# Patient Record
Sex: Female | Born: 1990 | Hispanic: Yes | Marital: Married | Smoking: Never smoker
Health system: Southern US, Community
[De-identification: ages and names within clinical notes are randomized; demographics above are authoritative.]

## PROBLEM LIST (undated history)

## (undated) ENCOUNTER — Inpatient Hospital Stay: Payer: Self-pay

## (undated) DIAGNOSIS — R519 Headache, unspecified: Secondary | ICD-10-CM

## (undated) DIAGNOSIS — Z789 Other specified health status: Secondary | ICD-10-CM

## (undated) DIAGNOSIS — Z8611 Personal history of tuberculosis: Secondary | ICD-10-CM

## (undated) HISTORY — DX: Personal history of tuberculosis: Z86.11

## (undated) HISTORY — DX: Headache, unspecified: R51.9

## (undated) HISTORY — PX: NO PAST SURGERIES: SHX2092

---
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
## 2008-03-07 ENCOUNTER — Emergency Department: Payer: Self-pay | Admitting: Emergency Medicine

## 2009-06-10 ENCOUNTER — Ambulatory Visit: Payer: Self-pay | Admitting: Family Medicine

## 2010-01-06 ENCOUNTER — Inpatient Hospital Stay: Payer: Self-pay

## 2010-05-02 ENCOUNTER — Ambulatory Visit: Payer: Self-pay | Admitting: Family Medicine

## 2013-02-06 ENCOUNTER — Emergency Department: Payer: Self-pay | Admitting: Emergency Medicine

## 2013-10-11 ENCOUNTER — Encounter (HOSPITAL_COMMUNITY): Payer: Self-pay | Admitting: Emergency Medicine

## 2013-10-11 ENCOUNTER — Emergency Department (HOSPITAL_COMMUNITY)
Admission: EM | Admit: 2013-10-11 | Discharge: 2013-10-11 | Disposition: A | Discharge status: Home / Self Care | Payer: BC Managed Care – PPO | Attending: Emergency Medicine | Admitting: Emergency Medicine

## 2013-10-11 DIAGNOSIS — R51 Headache: Secondary | ICD-10-CM | POA: Insufficient documentation

## 2013-10-11 DIAGNOSIS — R0789 Other chest pain: Secondary | ICD-10-CM | POA: Insufficient documentation

## 2013-10-11 MED ORDER — METOCLOPRAMIDE HCL 10 MG PO TABS
10.0000 mg | ORAL_TABLET | Freq: Once | ORAL | Status: AC
  Administered 2013-10-11: 10 mg via ORAL
  Filled 2013-10-11: qty 1

## 2013-10-11 MED ORDER — METOCLOPRAMIDE HCL 10 MG PO TABS: 10.0000 mg | ORAL_TABLET | Freq: Four times a day (QID) | ORAL | Status: DC | PRN

## 2013-10-11 NOTE — ED Provider Notes (Addendum)
CSN: [id number]     Arrival date & time 10/11/13  1104 History   First MD Initiated Contact with Patient 10/11/13 1112     Chief Complaint  Patient presents with  . Headache   (Consider location/radiation/quality/duration/timing/severity/associated sxs/prior Treatment) HPI Complains of headache gradual onset yesterday. The frontal has moved occiput. No treatment prior to coming here no fever no nausea no visual change no hearing change no neck pain. Patient gets headaches daily since he was a small child. She also reports anterior chest pain onset 2 days ago lasted approximately 6 hours worse with changing positions improved with remaining still nonexertional no associated shortness of breath pain was onset after she got into an argument with another individual. She has not had any chest pain since 10/09/2013. History reviewed. No pertinent past medical history. History reviewed. No pertinent past surgical history. History reviewed. No pertinent family history. History  Substance Use Topics  . Smoking status: Never Smoker   . Smokeless tobacco: Not on file  . Alcohol Use: No   cardiac risk factors none OB History   Grav Para Term Preterm Abortions TAB SAB Ect Mult Living                 Review of Systems  Constitutional: Negative.   Respiratory: Negative.   Cardiovascular: Positive for chest pain.  Gastrointestinal: Negative.   Musculoskeletal: Negative.   Skin: Negative.   Neurological: Positive for headaches.  Psychiatric/Behavioral: Negative.   All other systems reviewed and are negative.    Allergies  Review of patient's allergies indicates no known allergies.  Home Medications  No current outpatient prescriptions on file. BP 115/80  Pulse 91  Temp(Src) 98.1 F (36.7 C) (Oral)  Resp 20  Ht 5\' 1"  (1.549 m)  Wt 155 lb 4.8 oz (70.444 kg)  BMI 29.36 kg/m2  SpO2 99% Physical Exam  Nursing note and vitals reviewed. Constitutional: She is oriented to person, place,  and time. She appears well-developed and well-nourished.  HENT:  Head: Normocephalic and atraumatic.  Eyes: Conjunctivae are normal. Pupils are equal, round, and reactive to light.  Fundi benign  Neck: Neck supple. No tracheal deviation present. No thyromegaly present.  Cardiovascular: Normal rate and regular rhythm.   No murmur heard. Pulmonary/Chest: Effort normal and breath sounds normal.  Abdominal: Soft. Bowel sounds are normal. She exhibits no distension. There is no tenderness.  Musculoskeletal: Normal range of motion. She exhibits no edema and no tenderness.  Neurological: She is alert and oriented to person, place, and time. She has normal reflexes. She displays normal reflexes. No cranial nerve deficit. She exhibits normal muscle tone. Coordination normal.  Gait normal rhomBerg normal pronator drift normal. DTRs symmetric bilaterally knee jerk and biceps toes are bilaterally.  Skin: Skin is warm and dry. No rash noted.  Psychiatric: She has a normal mood and affect.    ED Course  Procedures (including critical care time) Labs Review Labs Reviewed - No data to display Imaging Review No results found.  EKG Interpretation   None      12:30 PM pain improved after treatment with Reglan by mouth. MDM  No diagnosis found. Headache are nonspecific and chronic. Emergent imaging not indicated. Chest pain likely musculoskeletal based on patient's description. She also has an element of anxiety. Chest pain started after argument. Highly atypical for ACS. PERC neg Intravenous medicine her IM as an outpatient. She requests oral medicine. Plan prescription Reglan. Followup with PMD at [facility] Diagnosis #1 nonspecific headache #  2 nonspecific chest pain     Doug Sou, MD 10/11/13 1238  Doug Sou, MD 10/11/13 2117

## 2013-10-11 NOTE — ED Notes (Signed)
Pt c/o generalized frontal HA x 2 days; pt denies vision change or nausea; pt sts some pain in chest area with movement 4 days ago but denies at present

## 2014-11-16 NOTE — L&D Delivery Note (Signed)
Delivery Note At 5:21 PM a viable and healthy female "Wendy Watson" was delivered via Vaginal, Spontaneous Delivery (Presentation: ; ROA, compound left hand ).  APGAR: 8, 9; weight 7 lb 11.1 oz (3490 g).   Placenta status: Intact, Spontaneous.  Cord: 3 vessels with the following complications: none  Anesthesia:  Local for repair Episiotomy:  none Lacerations:  Right labial Suture Repair: 3.0 vicryl rapide Est. Blood Loss (mL):  300  Mom to postpartum.  Baby to Couplet care / Skin to Skin.  Christeen Douglas 08/22/2015, 8:08 PM

## 2014-11-26 ENCOUNTER — Emergency Department: Payer: Self-pay | Admitting: Emergency Medicine

## 2015-01-24 ENCOUNTER — Ambulatory Visit: Payer: Self-pay | Admitting: Family Medicine

## 2015-01-25 LAB — OB RESULTS CONSOLE GC/CHLAMYDIA
CHLAMYDIA, DNA PROBE: NEGATIVE
Gonorrhea: NEGATIVE

## 2015-01-25 LAB — OB RESULTS CONSOLE VARICELLA ZOSTER ANTIBODY, IGG: Varicella: IMMUNE

## 2015-01-25 LAB — OB RESULTS CONSOLE ANTIBODY SCREEN: Antibody Screen: NEGATIVE

## 2015-01-25 LAB — OB RESULTS CONSOLE RUBELLA ANTIBODY, IGM: RUBELLA: IMMUNE

## 2015-01-25 LAB — OB RESULTS CONSOLE ABO/RH: RH TYPE: POSITIVE

## 2015-01-25 LAB — OB RESULTS CONSOLE HEPATITIS B SURFACE ANTIGEN: Hepatitis B Surface Ag: NEGATIVE

## 2015-01-25 LAB — OB RESULTS CONSOLE RPR: RPR: NONREACTIVE

## 2015-02-10 ENCOUNTER — Emergency Department: Payer: Self-pay | Admitting: Emergency Medicine

## 2015-03-26 ENCOUNTER — Other Ambulatory Visit: Payer: Self-pay | Admitting: Family Medicine

## 2015-03-26 DIAGNOSIS — Z3482 Encounter for supervision of other normal pregnancy, second trimester: Secondary | ICD-10-CM

## 2015-03-29 ENCOUNTER — Ambulatory Visit
Admission: RE | Admit: 2015-03-29 | Discharge: 2015-03-29 | Disposition: A | Discharge status: Home / Self Care | Payer: Managed Care, Other (non HMO) | Source: Ambulatory Visit | Attending: Family Medicine | Admitting: Family Medicine

## 2015-03-29 DIAGNOSIS — Z36 Encounter for antenatal screening of mother: Secondary | ICD-10-CM | POA: Insufficient documentation

## 2015-03-29 DIAGNOSIS — Z3482 Encounter for supervision of other normal pregnancy, second trimester: Secondary | ICD-10-CM

## 2015-03-29 DIAGNOSIS — Z3A19 19 weeks gestation of pregnancy: Secondary | ICD-10-CM | POA: Insufficient documentation

## 2015-05-06 ENCOUNTER — Encounter: Payer: Self-pay | Admitting: *Deleted

## 2015-05-06 ENCOUNTER — Observation Stay
Admission: EM | Admit: 2015-05-06 | Discharge: 2015-05-06 | Disposition: A | Discharge status: Home / Self Care | Payer: Managed Care, Other (non HMO) | Attending: Obstetrics and Gynecology | Admitting: Obstetrics and Gynecology

## 2015-05-06 DIAGNOSIS — R109 Unspecified abdominal pain: Secondary | ICD-10-CM | POA: Insufficient documentation

## 2015-05-06 DIAGNOSIS — O26899 Other specified pregnancy related conditions, unspecified trimester: Principal | ICD-10-CM | POA: Insufficient documentation

## 2015-05-06 HISTORY — DX: Other specified health status: Z78.9

## 2015-05-06 NOTE — Discharge Instructions (Signed)
Patient may use comfort measures for discomforts.   Appropriate fetal movement reviewed.  Preterm labor reviewed.  Patient verbalizes u/o same.

## 2015-05-06 NOTE — OB Triage Note (Signed)
Presents with c/o abd pain that radiates into pelvic area that began around 1830.  Denies bleeding, SROM or decreased fetal movement.  Denies any health concerns.  States she has frequent headaches for which Bernestine Amass has prescribed Acetaminophen that is stronger than normal but is unable to identify medication completely.  She takes 1-2 per day.

## 2015-05-22 ENCOUNTER — Encounter: Payer: Self-pay | Admitting: *Deleted

## 2015-05-22 ENCOUNTER — Observation Stay
Admission: EM | Admit: 2015-05-22 | Discharge: 2015-05-22 | Disposition: A | Discharge status: Home / Self Care | Payer: Managed Care, Other (non HMO) | Attending: Obstetrics and Gynecology | Admitting: Obstetrics and Gynecology

## 2015-05-22 DIAGNOSIS — O26899 Other specified pregnancy related conditions, unspecified trimester: Secondary | ICD-10-CM | POA: Diagnosis not present

## 2015-05-22 DIAGNOSIS — R109 Unspecified abdominal pain: Secondary | ICD-10-CM | POA: Diagnosis present

## 2015-05-22 LAB — URINALYSIS COMPLETE WITH MICROSCOPIC (ARMC ONLY)
BILIRUBIN URINE: NEGATIVE
Bacteria, UA: NONE SEEN
Glucose, UA: NEGATIVE mg/dL
Hgb urine dipstick: NEGATIVE
KETONES UR: NEGATIVE mg/dL
Leukocytes, UA: NEGATIVE
Nitrite: NEGATIVE
PH: 6 (ref 5.0–8.0)
Protein, ur: NEGATIVE mg/dL
SPECIFIC GRAVITY, URINE: 1.023 (ref 1.005–1.030)

## 2015-05-22 NOTE — OB Triage Note (Signed)
See here 6/20 for abdominal pain. Reports same pain. Wendy Watson, Anthany Thornhill S

## 2015-06-06 LAB — OB RESULTS CONSOLE HIV ANTIBODY (ROUTINE TESTING): HIV: NONREACTIVE

## 2015-06-20 ENCOUNTER — Observation Stay
Admission: EM | Admit: 2015-06-20 | Discharge: 2015-06-20 | Disposition: A | Discharge status: Home / Self Care | Payer: Managed Care, Other (non HMO) | Attending: Obstetrics and Gynecology | Admitting: Obstetrics and Gynecology

## 2015-06-20 ENCOUNTER — Encounter: Payer: Self-pay | Admitting: *Deleted

## 2015-06-20 DIAGNOSIS — Z3A31 31 weeks gestation of pregnancy: Secondary | ICD-10-CM | POA: Diagnosis not present

## 2015-06-20 DIAGNOSIS — O42913 Preterm premature rupture of membranes, unspecified as to length of time between rupture and onset of labor, third trimester: Principal | ICD-10-CM | POA: Insufficient documentation

## 2015-06-20 DIAGNOSIS — O4290 Premature rupture of membranes, unspecified as to length of time between rupture and onset of labor, unspecified weeks of gestation: Secondary | ICD-10-CM | POA: Diagnosis present

## 2015-06-20 NOTE — Discharge Instructions (Signed)
Patient has a appt scheduled for July 04, 2015 at [facility].  Return or call for problems or concerns.

## 2015-06-20 NOTE — H&P (Signed)
Wendy Watson is a 24 y.o. female [phone] presenting for ?PROM sent by clinic as Nitrazine pos. Pt has LMP of questionable date with dating by 19 5/7 week [place] with EDD of 08/18/15. NO fluid seen on perineum or with a cough.  Maternal Medical History:  Reason for admission: Rupture of membranes.   Contractions: Onset was 3-5 hours ago.   Perceived severity is moderate.    Fetal activity: Perceived fetal activity is normal.     Very little records avail.from [facility] Past Medical History  Diagnosis Date  . Medical history non-contributory     OB History    Gravida Para Term Preterm AB TAB SAB Ectopic Multiple Living   0      2     PMH: epistaxis, HA, Elevated Transaminases, Epigastric pain, low back pain, Multip Past Surgical History  Procedure Laterality Date  . No past surgeries     Family History: family history is not on file. Social History:  reports that she has never smoked. She has never used smokeless tobacco. She reports that she does not drink alcohol or use illicit drugs. Gen: 24 yo hispanic female in NAD HEENT: eyes non-icteric HEART: S1S2, RRR, no M/R/G Lungs: CTA bilat, no W/R/R Abd: Gravid, FHR 140. 10 x 10 accels, 15 x 15 BPM earlier   Prenatal Transfer Tool  Maternal Diabetes: No Genetic Screening:not found Maternal Ultrasounds/Referrals: Normal Fetal Ultrasounds or other Referrals:  None Maternal Substance Abuse:  No Significant Maternal Medications:  None Significant Maternal Lab Results:  None Other Comments:  None  ROS Benign x 9   Blood pressure 120/83, pulse 101, temperature 97.9 F (36.6 C), temperature source Oral, resp. rate 16, height  (1.6 m), weight 83.462 kg (184 lb), last menstrual period 11/11/2014. Exam  Cx: Spec exam with no fluid with cough and only white dc present. Nitrazine is neg but, there is a few drops that look blue. Neg ferning,  Cx: 1/20%/vtx-2 Physical Exam  Prenatal labs: not  found(will call in am for results) ABO, Rh:   Antibody:   Rubella:   RPR:    HBsAg:    HIV:    GBS:     Assessment/Plan: 1. IUP at 30 weeks 2. Th PPROM P: Cont OB care and observe for any furtjher signs of PPROM or labor. 2. FU here for any further concerns  Sharee Pimple 06/20/2015, 8:16 PM

## 2015-07-24 LAB — OB RESULTS CONSOLE GBS: STREP GROUP B AG: NEGATIVE

## 2015-08-04 ENCOUNTER — Observation Stay
Admission: EM | Admit: 2015-08-04 | Discharge: 2015-08-04 | Disposition: A | Discharge status: Home / Self Care | Payer: Managed Care, Other (non HMO) | Attending: Obstetrics and Gynecology | Admitting: Obstetrics and Gynecology

## 2015-08-04 ENCOUNTER — Encounter: Payer: Self-pay | Admitting: *Deleted

## 2015-08-04 DIAGNOSIS — Z3A38 38 weeks gestation of pregnancy: Secondary | ICD-10-CM | POA: Diagnosis not present

## 2015-08-04 LAB — PROTEIN / CREATININE RATIO, URINE
CREATININE, URINE: 188 mg/dL
PROTEIN CREATININE RATIO: 0.13 mg/mg{creat} (ref 0.00–0.15)
Total Protein, Urine: 24 mg/dL

## 2015-08-04 LAB — URINALYSIS COMPLETE WITH MICROSCOPIC (ARMC ONLY)
BILIRUBIN URINE: NEGATIVE
Bacteria, UA: NONE SEEN
GLUCOSE, UA: 50 mg/dL — AB
HGB URINE DIPSTICK: NEGATIVE
Ketones, ur: NEGATIVE mg/dL
Nitrite: NEGATIVE
PH: 6 (ref 5.0–8.0)
Protein, ur: NEGATIVE mg/dL
Specific Gravity, Urine: 1.026 (ref 1.005–1.030)

## 2015-08-04 LAB — CBC
HCT: 33.3 % — ABNORMAL LOW (ref 35.0–47.0)
Hemoglobin: 10.9 g/dL — ABNORMAL LOW (ref 12.0–16.0)
MCH: 26.4 pg (ref 26.0–34.0)
MCHC: 32.5 g/dL (ref 32.0–36.0)
MCV: 81.1 fL (ref 80.0–100.0)
PLATELETS: 244 10*3/uL (ref 150–440)
RBC: 4.11 MIL/uL (ref 3.80–5.20)
RDW: 14.6 % — AB (ref 11.5–14.5)
WBC: 11.1 10*3/uL — ABNORMAL HIGH (ref 3.6–11.0)

## 2015-08-04 NOTE — OB Triage Note (Signed)
C/o vaginal burning, itching, white discharge X 2 days. Wendy Watson

## 2015-08-22 ENCOUNTER — Inpatient Hospital Stay: Payer: Managed Care, Other (non HMO) | Admitting: Registered Nurse

## 2015-08-22 ENCOUNTER — Inpatient Hospital Stay
Admission: EM | Admit: 2015-08-22 | Discharge: 2015-08-24 | DRG: 775 | Disposition: A | Discharge status: Home / Self Care | Payer: Managed Care, Other (non HMO) | Attending: Obstetrics and Gynecology | Admitting: Obstetrics and Gynecology

## 2015-08-22 DIAGNOSIS — O480 Post-term pregnancy: Secondary | ICD-10-CM | POA: Diagnosis present

## 2015-08-22 DIAGNOSIS — O4292 Full-term premature rupture of membranes, unspecified as to length of time between rupture and onset of labor: Principal | ICD-10-CM | POA: Diagnosis present

## 2015-08-22 DIAGNOSIS — Z3A40 40 weeks gestation of pregnancy: Secondary | ICD-10-CM

## 2015-08-22 LAB — TYPE AND SCREEN
ABO/RH(D): O POS
ANTIBODY SCREEN: NEGATIVE

## 2015-08-22 LAB — CBC
HEMATOCRIT: 34.2 % — AB (ref 35.0–47.0)
HEMOGLOBIN: 11.2 g/dL — AB (ref 12.0–16.0)
MCH: 26.3 pg (ref 26.0–34.0)
MCHC: 32.8 g/dL (ref 32.0–36.0)
MCV: 80.2 fL (ref 80.0–100.0)
Platelets: 216 10*3/uL (ref 150–440)
RBC: 4.27 MIL/uL (ref 3.80–5.20)
RDW: 15.6 % — ABNORMAL HIGH (ref 11.5–14.5)
WBC: 9.9 10*3/uL (ref 3.6–11.0)

## 2015-08-22 MED ORDER — PENICILLIN G POTASSIUM 5000000 UNITS IJ SOLR: 5.0000 10*6.[IU] | Freq: Once | INTRAVENOUS | Status: DC

## 2015-08-22 MED ORDER — FENTANYL 2.5 MCG/ML W/ROPIVACAINE 0.2% IN NS 100 ML EPIDURAL INFUSION (ARMC-ANES): EPIDURAL | Status: DC | PRN

## 2015-08-22 MED ORDER — OXYTOCIN 10 UNIT/ML IJ SOLN
INTRAMUSCULAR | Status: AC
  Filled 2015-08-22: qty 2

## 2015-08-22 MED ORDER — ZOLPIDEM TARTRATE 5 MG PO TABS: 5.0000 mg | ORAL_TABLET | Freq: Every evening | ORAL | Status: DC | PRN

## 2015-08-22 MED ORDER — OXYTOCIN 40 UNITS IN LACTATED RINGERS INFUSION - SIMPLE MED: 62.5000 mL/h | INTRAVENOUS | Status: DC | PRN

## 2015-08-22 MED ORDER — OXYTOCIN 40 UNITS IN LACTATED RINGERS INFUSION - SIMPLE MED
1.0000 m[IU]/min | INTRAVENOUS | Status: DC
  Administered 2015-08-22: 1 m[IU]/min via INTRAVENOUS

## 2015-08-22 MED ORDER — DIPHENHYDRAMINE HCL 25 MG PO CAPS: 25.0000 mg | ORAL_CAPSULE | Freq: Four times a day (QID) | ORAL | Status: DC | PRN

## 2015-08-22 MED ORDER — LACTATED RINGERS IV SOLN: 500.0000 mL | INTRAVENOUS | Status: DC | PRN

## 2015-08-22 MED ORDER — BUPIVACAINE HCL (PF) 0.25 % IJ SOLN: INTRAMUSCULAR | Status: DC | PRN

## 2015-08-22 MED ORDER — LACTATED RINGERS IV SOLN
INTRAVENOUS | Status: DC
  Administered 2015-08-22: 10:00:00 via INTRAVENOUS

## 2015-08-22 MED ORDER — PENICILLIN G POTASSIUM 5000000 UNITS IJ SOLR: 2.5000 10*6.[IU] | INTRAVENOUS | Status: DC

## 2015-08-22 MED ORDER — ONDANSETRON HCL 4 MG/2ML IJ SOLN: 4.0000 mg | Freq: Four times a day (QID) | INTRAMUSCULAR | Status: DC | PRN

## 2015-08-22 MED ORDER — LIDOCAINE HCL (PF) 1 % IJ SOLN
INTRAMUSCULAR | Status: AC
  Filled 2015-08-22: qty 30

## 2015-08-22 MED ORDER — FLEET ENEMA 7-19 GM/118ML RE ENEM: 1.0000 | ENEMA | Freq: Every day | RECTAL | Status: DC | PRN

## 2015-08-22 MED ORDER — OXYCODONE-ACETAMINOPHEN 5-325 MG PO TABS: 2.0000 | ORAL_TABLET | ORAL | Status: DC | PRN

## 2015-08-22 MED ORDER — MISOPROSTOL 200 MCG PO TABS
ORAL_TABLET | ORAL | Status: AC
  Filled 2015-08-22: qty 4

## 2015-08-22 MED ORDER — ACETAMINOPHEN 325 MG PO TABS: 650.0000 mg | ORAL_TABLET | ORAL | Status: DC | PRN

## 2015-08-22 MED ORDER — BISACODYL 10 MG RE SUPP: 10.0000 mg | Freq: Every day | RECTAL | Status: DC | PRN

## 2015-08-22 MED ORDER — TETANUS-DIPHTH-ACELL PERTUSSIS 5-2.5-18.5 LF-MCG/0.5 IM SUSP: 0.5000 mL | Freq: Once | INTRAMUSCULAR | Status: DC

## 2015-08-22 MED ORDER — CITRIC ACID-SODIUM CITRATE 334-500 MG/5ML PO SOLN: 30.0000 mL | ORAL | Status: DC | PRN

## 2015-08-22 MED ORDER — BENZOCAINE-MENTHOL 20-0.5 % EX AERO
1.0000 "application " | INHALATION_SPRAY | CUTANEOUS | Status: DC | PRN
  Administered 2015-08-23: 1 via TOPICAL
  Filled 2015-08-22: qty 56

## 2015-08-22 MED ORDER — AMMONIA AROMATIC IN INHA
RESPIRATORY_TRACT | Status: AC
  Filled 2015-08-22: qty 10

## 2015-08-22 MED ORDER — LIDOCAINE-EPINEPHRINE (PF) 1.5 %-1:200000 IJ SOLN: INTRAMUSCULAR | Status: DC | PRN

## 2015-08-22 MED ORDER — SODIUM CHLORIDE 0.9 % IJ SOLN: 3.0000 mL | Freq: Two times a day (BID) | INTRAMUSCULAR | Status: DC

## 2015-08-22 MED ORDER — WITCH HAZEL-GLYCERIN EX PADS: 1.0000 "application " | MEDICATED_PAD | CUTANEOUS | Status: DC | PRN

## 2015-08-22 MED ORDER — SODIUM CHLORIDE 0.9 % IJ SOLN: 3.0000 mL | INTRAMUSCULAR | Status: DC | PRN

## 2015-08-22 MED ORDER — ONDANSETRON HCL 4 MG/2ML IJ SOLN: 4.0000 mg | INTRAMUSCULAR | Status: DC | PRN

## 2015-08-22 MED ORDER — BUTORPHANOL TARTRATE 1 MG/ML IJ SOLN
1.0000 mg | INTRAMUSCULAR | Status: DC | PRN
  Administered 2015-08-22: 1 mg via INTRAVENOUS
  Filled 2015-08-22: qty 1

## 2015-08-22 MED ORDER — MEASLES, MUMPS & RUBELLA VAC ~~LOC~~ INJ: 0.5000 mL | INJECTION | Freq: Once | SUBCUTANEOUS | Status: DC

## 2015-08-22 MED ORDER — OXYCODONE-ACETAMINOPHEN 5-325 MG PO TABS: 1.0000 | ORAL_TABLET | ORAL | Status: DC | PRN

## 2015-08-22 MED ORDER — DIBUCAINE 1 % RE OINT
1.0000 | TOPICAL_OINTMENT | RECTAL | Status: DC | PRN
Start: 2015-08-22 — End: 2015-08-24

## 2015-08-22 MED ORDER — PRENATAL MULTIVITAMIN CH
1.0000 | ORAL_TABLET | Freq: Every day | ORAL | Status: DC
  Administered 2015-08-23 – 2015-08-24 (×2): 1 via ORAL
  Filled 2015-08-22 (×2): qty 1

## 2015-08-22 MED ORDER — TERBUTALINE SULFATE 1 MG/ML IJ SOLN: 0.2500 mg | Freq: Once | INTRAMUSCULAR | Status: DC | PRN

## 2015-08-22 MED ORDER — LANOLIN HYDROUS EX OINT: TOPICAL_OINTMENT | CUTANEOUS | Status: DC | PRN

## 2015-08-22 MED ORDER — SIMETHICONE 80 MG PO CHEW: 80.0000 mg | CHEWABLE_TABLET | ORAL | Status: DC | PRN

## 2015-08-22 MED ORDER — OXYTOCIN 40 UNITS IN LACTATED RINGERS INFUSION - SIMPLE MED
62.5000 mL/h | INTRAVENOUS | Status: DC
  Filled 2015-08-22: qty 1000

## 2015-08-22 MED ORDER — SODIUM CHLORIDE 0.9 % IV SOLN: 250.0000 mL | INTRAVENOUS | Status: DC | PRN

## 2015-08-22 MED ORDER — IBUPROFEN 600 MG PO TABS
600.0000 mg | ORAL_TABLET | Freq: Four times a day (QID) | ORAL | Status: DC
  Administered 2015-08-22 – 2015-08-24 (×6): 600 mg via ORAL
  Filled 2015-08-22 (×6): qty 1

## 2015-08-22 MED ORDER — LIDOCAINE HCL (PF) 1 % IJ SOLN
30.0000 mL | INTRAMUSCULAR | Status: DC | PRN
Start: 2015-08-22 — End: 2015-08-22

## 2015-08-22 MED ORDER — OXYTOCIN BOLUS FROM INFUSION
500.0000 mL | INTRAVENOUS | Status: DC
  Administered 2015-08-22: 500 mL via INTRAVENOUS

## 2015-08-22 MED ORDER — SENNOSIDES-DOCUSATE SODIUM 8.6-50 MG PO TABS
2.0000 | ORAL_TABLET | ORAL | Status: DC
  Administered 2015-08-22 – 2015-08-23 (×2): 2 via ORAL
  Filled 2015-08-22 (×2): qty 2

## 2015-08-22 MED ORDER — ONDANSETRON HCL 4 MG PO TABS: 4.0000 mg | ORAL_TABLET | ORAL | Status: DC | PRN

## 2015-08-22 NOTE — Anesthesia Procedure Notes (Deleted)
Procedures

## 2015-08-22 NOTE — Progress Notes (Signed)
Wendy Watson is a 24 y.o. G3P2002 at [redacted]w[redacted]d  admitted for PROM  Subjective: Pt feeling contractions strongly, no urge to push  Objective: BP 115/82 mmHg  Pulse 77  Temp(Src) 98.4 F (36.9 C) (Oral)  Resp 18  Ht  (1.6 m)  Wt 83.462 kg (184 lb)  BMI 32.60 kg/m2  LMP 11/11/2014      FHT:  FHR: 130 bpm, variability: moderate,  accelerations:  Present,  decelerations:  Absent UC:   regular, every 2-3 minutes, pitocin at 5 SVE:   Dilation: 5 Effacement (%): 50 Station: -2 Exam by:: Coble  Labs: Lab Results  Component Value Date   WBC 9.9 08/22/2015   HGB 11.2* 08/22/2015   HCT 34.2* 08/22/2015   MCV 80.2 08/22/2015   PLT 216 08/22/2015    Assessment / Plan: Induction of labor due to PROM,  progressing well on pitocin  Labor: Progressing Fetal Wellbeing:  Category I Pain Control:  if desired, patient currently declining I/D:  afebrile, no uterine tenderness, fluid clear Anticipated MOD:  NSVD  [name] 08/22/2015, 12:26 PM

## 2015-08-22 NOTE — Anesthesia Preprocedure Evaluation (Deleted)
Anesthesia Evaluation  Patient identified by MRN, date of birth, ID band Patient awake    Reviewed: Allergy & Precautions, H&P , NPO status , Patient's Chart, lab work & pertinent test results, Unable to perform ROS - Chart review only  History of Anesthesia Complications Negative for: history of anesthetic complications  Airway Mallampati: II  TM Distance: >3 FB Neck ROM: full    Dental no notable dental hx.    Pulmonary Current Smoker,    Pulmonary exam normal        Cardiovascular negative cardio ROS Normal cardiovascular exam     Neuro/Psych negative neurological ROS  negative psych ROS   GI/Hepatic negative GI ROS, Neg liver ROS,   Endo/Other  negative endocrine ROS  Renal/GU negative Renal ROS  negative genitourinary   Musculoskeletal   Abdominal   Peds  Hematology negative hematology ROS (+)   Anesthesia Other Findings   Reproductive/Obstetrics (+) Pregnancy                             Anesthesia Physical Anesthesia Plan  ASA: II  Anesthesia Plan: Epidural   Post-op Pain Management:    Induction:   Airway Management Planned:   Additional Equipment:   Intra-op Plan:   Post-operative Plan:   Informed Consent: I have reviewed the patients History and Physical, chart, labs and discussed the procedure including the risks, benefits and alternatives for the proposed anesthesia with the patient or authorized representative who has indicated his/her understanding and acceptance.     Plan Discussed with: Anesthesiologist  Anesthesia Plan Comments:         Anesthesia Quick Evaluation

## 2015-08-22 NOTE — H&P (Signed)
Wendy Watson is a 24 y.o. female presenting for PROM since 10:00am yesterday, now 24hrs ago. Irregular contractions q7 min. Normal fetal activity. No fever or abdominal pain.  Maternal Medical History:  Reason for admission: Rupture of membranes.  Nausea.  Contractions: Frequency: irregular.   Perceived severity is mild.    Fetal activity: Perceived fetal activity is normal.    Prenatal complications: no prenatal complications   OB History    Gravida Para Term Preterm AB TAB SAB Ectopic Multiple Living   0      2     Past Medical History  Diagnosis Date  . Medical history non-contributory    Past Surgical History  Procedure Laterality Date  . No past surgeries     Family History: family history is not on file. Social History:  reports that she has never smoked. She has never used smokeless tobacco. She reports that she does not drink alcohol or use illicit drugs.   Prenatal Transfer Tool    Review of Systems  Constitutional: Negative for fever and chills.  Eyes: Negative for blurred vision and double vision.  Respiratory: Negative for shortness of breath.   Cardiovascular: Negative for chest pain and palpitations.  Gastrointestinal: Negative for nausea, vomiting, abdominal pain, diarrhea and constipation.  Genitourinary: Negative for dysuria, urgency, frequency and flank pain.  Neurological: Negative for headaches.  Psychiatric/Behavioral: Negative for depression.    Dilation: 5 Effacement (%): 50 Station: -2 Exam by:: Coble Blood pressure 115/82, pulse 77, temperature 98.4 F (36.9 C), temperature source Oral, resp. rate 18, height  (1.6 m), weight 83.462 kg (184 lb), last menstrual period 11/11/2014. Exam Physical Exam  Constitutional: She is oriented to person, place, and time. She appears well-developed and well-nourished. No distress.  Eyes: No scleral icterus.  Neck: Normal range of motion. Neck supple.  Cardiovascular: Normal rate.    Respiratory: Effort normal. No respiratory distress.  GI: Soft. She exhibits no distension. There is no tenderness.  Genitourinary: Vagina normal and uterus normal.  Musculoskeletal: Normal range of motion.  Neurological: She is alert and oriented to person, place, and time.  Skin: Skin is warm and dry.  Psychiatric: She has a normal mood and affect.    Prenatal labs: ABO, Rh: --/--/PENDING (10/06 1012) Antibody: PENDING (10/06 1012) Rubella: Immune (03/11 0000) RPR: Nonreactive (03/11 0000)  HBsAg: Negative (03/11 0000)  HIV: Non-reactive (07/21 0000)  GBS: Negative (09/07 0000)   Assessment/Plan: 1. Induction for PROM- begin pitocin promptly 2. Limit  Cervical exams, monitor for chorio 3. May have epidural if desired 4. Continuous fetal montiring while on pitocin 5. GBS neg - abx only if develops s/s infection 6. Anticipate vaginal delivery  Christeen Douglas 08/22/2015, 12:26 PM

## 2015-08-23 LAB — CBC
HEMATOCRIT: 32.4 % — AB (ref 35.0–47.0)
HEMOGLOBIN: 10.7 g/dL — AB (ref 12.0–16.0)
MCH: 26.6 pg (ref 26.0–34.0)
MCHC: 32.9 g/dL (ref 32.0–36.0)
MCV: 80.7 fL (ref 80.0–100.0)
Platelets: 203 10*3/uL (ref 150–440)
RBC: 4.01 MIL/uL (ref 3.80–5.20)
RDW: 14.9 % — ABNORMAL HIGH (ref 11.5–14.5)
WBC: 13.2 10*3/uL — ABNORMAL HIGH (ref 3.6–11.0)

## 2015-08-23 LAB — RPR: RPR Ser Ql: NONREACTIVE

## 2015-08-23 MED ORDER — POLYSACCHARIDE IRON COMPLEX 150 MG PO CAPS
150.0000 mg | ORAL_CAPSULE | Freq: Every day | ORAL | Status: DC
  Administered 2015-08-23 – 2015-08-24 (×2): 150 mg via ORAL
  Filled 2015-08-23 (×2): qty 1

## 2015-08-23 NOTE — Progress Notes (Signed)
PPD # 1, SVD, baby boy "Isreal"  S:  Reports feeling tired, was not able to sleep much last night             Tolerating po/ No nausea or vomiting             Bleeding is light             Pain controlled with Motrin, mainly having cramping with breastfeeding and pain with urination             Up ad lib / ambulatory / voiding QS  Newborn breast feeding - going well   O:               VS: BP 110/58 mmHg  Pulse 74  Temp(Src) 98.2 F (36.8 C) (Oral)  Resp 18  Ht  (1.6 m)  Wt 83.462 kg (184 lb)  BMI 32.60 kg/m2  LMP 11/11/2014   LABS:              Recent Labs  08/22/15 1012 08/23/15 0512  WBC 9.9 13.2*  HGB 11.2* 10.7*  PLT 216 203               Blood type: --/--/O POS (10/06 1012)  Rubella: Immune (03/11 0000)                     I&O: Intake/Output      10/06 0701 - 10/07 0700 10/07 0701 - 10/08 0700   I.V. (mL/kg) 2503.3 (30)    Total Intake(mL/kg) 2503.3 (30)    Urine (mL/kg/hr) 350    Total Output 350     Net +2153.3          Urine Occurrence 1 x                  Physical Exam:             Alert and oriented X3  Lungs: Clear and unlabored  Heart: regular rate and rhythm / no mumurs  Abdomen: soft, non-tender, non-distended / abdominal binder on              Fundus: firm, non-tender, U-E  Perineum: well-approximated right labial laceration, healing well / no significant edema / no erythema / no ecchymosis  Lochia: light, no clots  Extremities: no edema, no calf pain or tenderness    A: PPD # 1, SVD   Doing well - stable status  Mild ABL Anemia  P: Routine post partum orders  Dermoplast spray to perineum today  Niferex  daily  Planning postpartum tubal ligation in 2-4 weeks with BEB  Anticipate discharge home tomorrow   Karena Addison, CNM

## 2015-08-24 LAB — CBC WITH DIFFERENTIAL/PLATELET
Basophils Absolute: 0.1 10*3/uL (ref 0–0.1)
Basophils Relative: 1 %
Eosinophils Absolute: 0.3 10*3/uL (ref 0–0.7)
Eosinophils Relative: 3 %
HEMATOCRIT: 33.6 % — AB (ref 35.0–47.0)
HEMOGLOBIN: 10.6 g/dL — AB (ref 12.0–16.0)
LYMPHS ABS: 2.2 10*3/uL (ref 1.0–3.6)
Lymphocytes Relative: 20 %
MCH: 25.6 pg — AB (ref 26.0–34.0)
MCHC: 31.5 g/dL — AB (ref 32.0–36.0)
MCV: 81.2 fL (ref 80.0–100.0)
MONOS PCT: 6 %
Monocytes Absolute: 0.7 10*3/uL (ref 0.2–0.9)
NEUTROS ABS: 7.8 10*3/uL — AB (ref 1.4–6.5)
NEUTROS PCT: 70 %
Platelets: 238 10*3/uL (ref 150–440)
RBC: 4.14 MIL/uL (ref 3.80–5.20)
RDW: 14.9 % — ABNORMAL HIGH (ref 11.5–14.5)
WBC: 11.2 10*3/uL — ABNORMAL HIGH (ref 3.6–11.0)

## 2015-08-24 MED ORDER — POLYSACCHARIDE IRON COMPLEX 150 MG PO CAPS: 150.0000 mg | ORAL_CAPSULE | Freq: Every day | ORAL | Status: DC

## 2015-08-24 MED ORDER — OXYCODONE-ACETAMINOPHEN 5-325 MG PO TABS: 1.0000 | ORAL_TABLET | ORAL | Status: DC | PRN

## 2015-08-24 MED ORDER — IBUPROFEN 600 MG PO TABS
600.0000 mg | ORAL_TABLET | Freq: Four times a day (QID) | ORAL | Status: DC
Start: 2015-08-24 — End: 2018-07-17

## 2015-08-24 NOTE — Progress Notes (Signed)
Discharge instructions provided.  Pt and sig other verbalize understanding of all instructions and follow-up care.  Prescriptions given.  Pt discharged to home with infant at 1845 on 08/24/15 via wheelchair by CNA. Reynold Bowen, RN 08/24/2015 7:57 PM

## 2015-08-24 NOTE — Discharge Instructions (Signed)
Cuidados en el postparto luego de un parto vaginal  °(Postpartum Care After Vaginal Delivery) °Después del parto (período de postparto), la estadía normal en el hospital es de 24-72 horas. Si hubo problemas con el trabajo de parto o el parto, o si tiene otros problemas médicos, es posible que deba permanecer en el hospital por más tiempo.  °Mientras esté en el hospital, recibirá ayuda e instrucciones sobre cómo cuidar de usted misma y de su bebé recién nacido durante el postparto.  °Mientras esté en el hospital:  °· Asegúrese de decirle a las enfermeras si siente dolor o malestar, así como donde siente el dolor y qué empeora el dolor. °· Si usted tuvo una incisión cerca de la vagina (episiotomía) o si ha tenido algún desgarro durante el parto, las enfermeras le pondrán hielo sobre la episiotomía o el desgarro. Las bolsas de hielo pueden ayudar a reducir el dolor y la hinchazón. °· Si está amamantando, puede sentir contracciones dolorosas en el útero durante algunas semanas. Esto es normal. Las contracciones ayudan a que el útero vuelva a su tamaño normal. °· Es normal tener algo de sangrado después del parto. °¨ Durante los primeros 1-3 días después del parto, el flujo es de color rojo y la cantidad puede ser similar a un período. °¨ Es frecuente que el flujo se inicie y se detenga. °¨ En los primeros días, puede eliminar algunos coágulos pequeños. Informe a las enfermeras si elimina coágulos grandes o aumenta el flujo. °¨ No  elimine los coágulos de sangre por el inodoro antes de que la enfermera los vea. °¨ Durante los próximos 3 a 10 días después del parto, el flujo debe ser más acuoso y rosado o marrón. °¨ De diez a catorce días después del parto, el flujo debe ser una pequeña cantidad de secreción de color blanco amarillento. °¨ La cantidad de flujo disminuirá en las primeras semanas después del parto. El flujo puede detenerse en 6-8 semanas. La mayoría de las mujeres no tienen más flujo a las 12 semanas  después del parto. °· Usted debe cambiar sus apósitos con frecuencia. °· Lávese bien las manos con agua y jabón durante al menos 20 segundos después de cambiar el apósito, usar el baño o antes de sostener o alimentar a su recién nacido. °· Usted podrá sentir como que tiene que vaciar la vejiga durante las primeras 6-8 horas después del parto. °· En caso de que sienta debilidad, mareo o desmayo, llame a la enfermera antes de levantarse de la cama por primera vez y antes de tomar una ducha por primera vez. °· Dentro de los primeros días después del parto, sus mamas pueden comenzar a estar sensibles y llenas. Esto se llama congestión. La sensibilidad en los senos por lo general desaparece dentro de las 48-72 horas después de que ocurre la congestión. También puede notar que la leche se escapa de sus senos. Si no está amamantando no estimule sus pechos. La estimulación de las mamas hace que sus senos produzcan más leche. °· Pasar tanto tiempo como le sea posible con el bebé recién nacido es muy importante. Durante ese tiempo, usted y su bebé deben sentirse cerca y conocerse uno al otro. Tener al bebé en su habitación (alojamiento conjunto) ayudará a fortalecer el vínculo con el bebé recién nacido. Esto le dará tiempo para conocerlo y atenderlo de manera cómoda. °· Las hormonas se modifican después del parto. A veces, los cambios hormonales pueden causar tristeza o ganas de llorar por un tiempo. Estos sentimientos   no deben durar ms de [employer]. Si duran ms que eso, debe hablar con su mdico.  Si lo desea, hable con su mdico acerca de los mtodos de planificacin familiar o mtodos anticonceptivos.  Hable con su mdico acerca de las vacunas. El mdico puede indicarle que se aplique las siguientes vacunas antes de salir del hospital:  [place] contra el ttanos, la difteria y la tos ferina (Tdap) o el ttanos y la difteria (Td). Es muy importante que usted y su familia (incluyendo a los abuelos) u otras  personas que cuidan al recin nacido estn al da con las vacunas Tdap o Td. Las vacunas Tdap o Td pueden ayudar a proteger al recin nacido de enfermedades.  Inmunizacin contra la rubola.  Inmunizacin contra la varicela.  Inmunizacin contra la gripe. Usted debe recibir esta vacunacin anual si no la ha recibido Academic librarian.   Esta informacin no tiene Theme park manager el consejo del mdico. Asegrese de hacerle al mdico cualquier pregunta que tenga.   Document Released: 08/30/2007 Document Revised: 07/27/2012 Elsevier Interactive Patient Education [employer]  Call your doctor for increased pain or vaginal bleeding, temperature above 100.4, depression, or concerns.  No strenuous activity or heavy lifting for 6 weeks.  No intercourse, tampons, douching, or enemas for 6 weeks.  No tub baths-showers only.  No driving for 2 weeks or while taking pain medications.  Continue prenatal vitamin and iron. Increase calories and fluids while breastfeeding.

## 2015-08-24 NOTE — Progress Notes (Signed)
Education provided on need for influenza and TDaP vaccines.  Pt declines vaccines at this time. Reynold Bowen, RN 08/24/2015 4:51 PM

## 2015-08-24 NOTE — Discharge Summary (Signed)
POSTPARTUM VAGINAL DELIVERY DISCHARGE SUMMARY:  Patient ID: Wendy Watson MRN: [id number] DOB/AGE: October 08, 1991 24 y.o.  Admit date: 08/22/2015 Admission Diagnoses: SROM with Active labor at term   Discharge date:  08/24/2015 Discharge Diagnoses: Postpartum care following vaginal delivery   Prenatal history: [id number]   EDC : 08/18/2015, by [place] Prenatal care at [facility]  Prenatal course complicated by: GBS negative - prolonged rupture of membranes >24 hours - no s/s of infection or chorio and not treated with antibiotics   Prenatal Labs: ABO, Rh: --/--/O POS (10/06 1012)  Antibody: NEG (10/06 1012) Rubella: Immune (03/11 0000)   RPR: Non Reactive (10/06 1012)  HBsAg: Negative (03/11 0000)  HIV: Non-reactive (07/21 0000)  GBS: Negative (09/07 0000)   Medical / Surgical History :  Past medical history:  Past Medical History  Diagnosis Date  . Medical history non-contributory     Past surgical history:  Past Surgical History  Procedure Laterality Date  . No past surgeries      Family History: History reviewed. No pertinent family history.  Social History:  reports that she has never smoked. She has never used smokeless tobacco. She reports that she does not drink alcohol or use illicit drugs.  Allergies: Review of patient's allergies indicates no known allergies.   Current Medications at time of admission:  Prior to Admission medications   Medication Sig Start Date End Date Taking? Authorizing Rossy Virag  acetaminophen (TYLENOL) 500 MG tablet Take 500 mg by mouth every 6 (six) hours as needed for headache.    Historical Amay Mijangos, MD  ibuprofen (ADVIL,MOTRIN) 600 MG tablet Take 1 tablet (600 mg total) by mouth every 6 (six) hours. 08/24/15   Karena Addison, CNM  iron polysaccharides (NIFEREX) 150 MG capsule Take 1 capsule (150 mg total) by mouth daily. 08/24/15   Karena Addison, CNM  oxyCODONE-acetaminophen (PERCOCET/ROXICET) 5-325 MG tablet  Take 1 tablet by mouth every 4 (four) hours as needed (for pain scale 4-7). 08/24/15   Karena Addison, CNM  Prenatal Vit-Fe Fumarate-FA (PRENATAL MULTIVITAMIN) TABS tablet Take 1 tablet by mouth daily at 12 noon.    Historical Embrie Mikkelsen, MD    Intrapartum Course:  Admit for SROM at home and active abor with labor progression to complete dilation  Pain management:  Complicated by: compound presentation ROA with left hand presentation  Right labial laceration repaired  Interventions required: none NSVD on 08/22/15 with delivery of  Viable female "Isreal" newborn by Dr. Dalbert Garnet    APGAR (1 MIN): 8   APGAR (5 MINS): 9    Postpartum course:  Uncomplicated with discharge on PPD #2  Discharge Instructions:  Discharged Condition: good  Activity: pelvic rest and postoperative restrictions x 2   Diet: routine  Medications:    Medication List    TAKE these medications        acetaminophen 500 MG tablet  Commonly known as:  TYLENOL  Take 500 mg by mouth every 6 (six) hours as needed for headache.     ibuprofen 600 MG tablet  Commonly known as:  ADVIL,MOTRIN  Take 1 tablet (600 mg total) by mouth every 6 (six) hours.     iron polysaccharides 150 MG capsule  Commonly known as:  NIFEREX  Take 1 capsule (150 mg total) by mouth daily.     oxyCODONE-acetaminophen 5-325 MG tablet  Commonly known as:  PERCOCET/ROXICET  Take 1 tablet by mouth every 4 (four) hours as needed (for pain scale 4-7).  prenatal multivitamin Tabs tablet  Take 1 tablet by mouth daily at 12 noon.        Postpartum Instructions:  Discharge Instructions    Activity as tolerated    Complete by:  As directed      Call MD for:  difficulty breathing, headache or visual disturbances    Complete by:  As directed      Call MD for:  extreme fatigue    Complete by:  As directed      Call MD for:  hives    Complete by:  As directed      Call MD for:  persistant dizziness or light-headedness    Complete by:  As  directed      Call MD for:  persistant nausea and vomiting    Complete by:  As directed      Call MD for:  redness, tenderness, or signs of infection (pain, swelling, redness, odor or green/yellow discharge around incision site)    Complete by:  As directed      Call MD for:  severe uncontrolled pain    Complete by:  As directed      Call MD for:  temperature >100.4    Complete by:  As directed      Call MD for:    Complete by:  As directed   Worsening s/s of depression, unable to take care of herself or baby     Diet - low sodium heart healthy    Complete by:  As directed      Sexual acrtivity    Complete by:  As directed   No intercourse or anything in the vagina for 6 weeks          Discharge to: Home  Follow up :   [facility] for interval in 2 weeks with Dr. Dalbert Garnet for pre-op visit for postpartum tubal ligation then in 6 weeks for routine postpartum visit          Signed:   Karena Addison, CNM

## 2015-08-24 NOTE — Progress Notes (Signed)
PPD #2, SVD, s/p prolonged ROM >24 hours  S:  Reports feeling better than yesterday, but baby was still up most of the night             Tolerating po/ No nausea or vomiting             Bleeding is light             Pain controlled with Motrin and Dermoplast spray to perineum - still having cramping with breastfeeding  Sore nipples - using comfort gels              Up ad lib / ambulatory / voiding QS  Newborn breast feeding    O:               VS: BP 104/69 mmHg  Pulse 80  Temp(Src) 98 F (36.7 C) (Oral)  Resp 18  Ht  (1.6 m)  Wt 83.462 kg (184 lb)  BMI 32.60 kg/m2  SpO2 99%  LMP 11/11/2014   LABS:              Recent Labs  08/22/15 1012 08/23/15 0512  WBC 9.9 13.2*  HGB 11.2* 10.7*  PLT 216 203               Blood type: --/--/O POS (10/06 1012)  Rubella: Immune (03/11 0000)                     I&O: Intake/Output      10/07 0701 - 10/08 0700 10/08 0701 - 10/09 0700   P.O. 240    I.V. (mL/kg)     Total Intake(mL/kg) 240 (2.9)    Urine (mL/kg/hr)     Total Output       Net +240                        Physical Exam:             Alert and oriented X3  Lungs: Clear and unlabored  Heart: regular rate and rhythm / no mumurs  Abdomen: soft, non-tender, non-distended              Fundus: firm, tender when massaging fundus, U-1  Perineum: well-approximated right labial laceration - healing well / no significant edema / no erythema / no ecchymosis  Lochia: scant, no clots, no odor   Extremities: no edema, no calf pain or tenderness    A: PPD # 2  Doing well - stable status  Mild ABL Anemia   P: Routine post partum orders  Recheck CBC with diff d/t Prolonged ROM at noon  Anticipate discharge home today around 5:20pm per Peds   Karena Addison, CNM

## 2015-08-31 LAB — ABO/RH: ABO/RH(D): O POS

## 2016-03-26 ENCOUNTER — Encounter (HOSPITAL_COMMUNITY): Payer: Self-pay | Admitting: *Deleted

## 2016-10-08 IMAGING — US US OB < 14 WEEKS
1 series · 14 of 28 positions shown · non-contrast
Comparison: None

CLINICAL DATA: Abdominal pain, no fetal heart tones

EXAM:
OBSTETRIC <14 WK ULTRASOUND
TECHNIQUE: Transabdominal ultrasound was performed for evaluation of the
gestation as well as the maternal uterus and adnexal regions.

[Series 1: us ob < 14 weeks · 0.20mm/px · 14 of 55 slices shown]
[im 3/55]
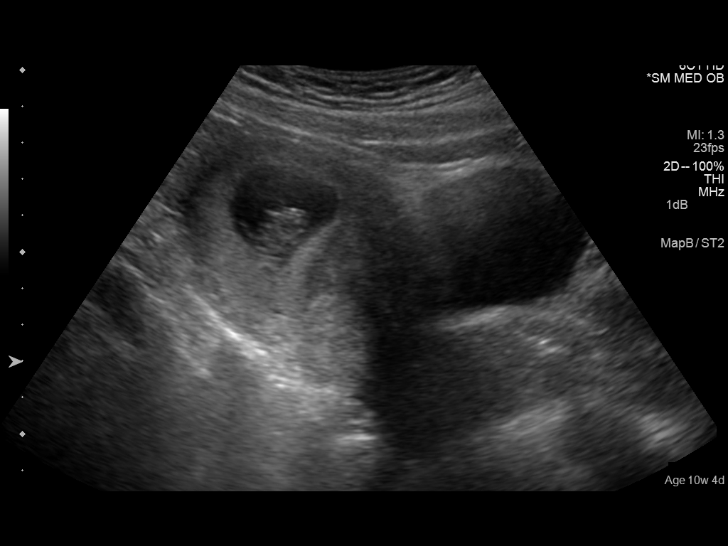
[im 7/55]
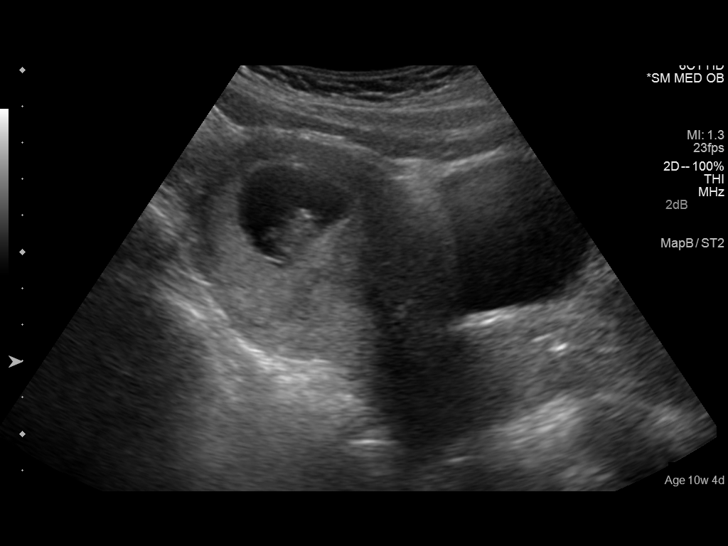
[im 11/55]
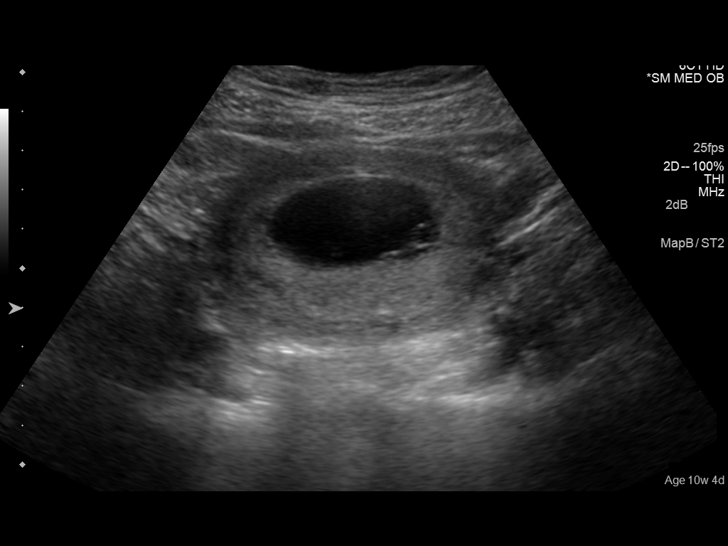
[im 15/55]
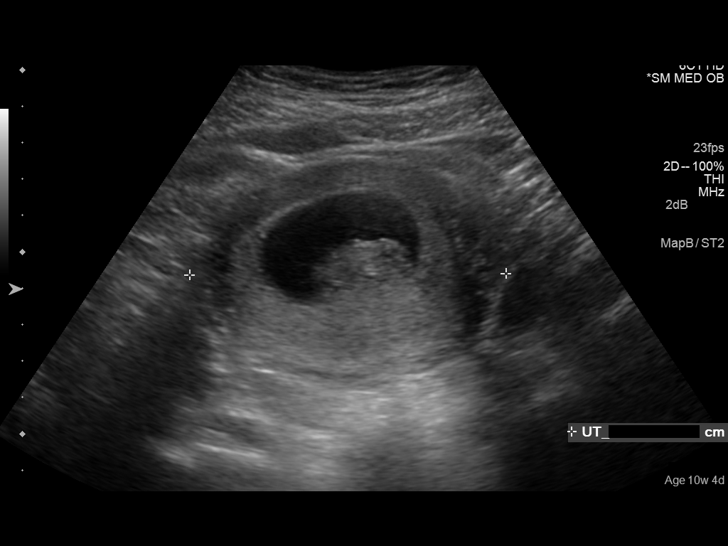
[im 19/55]
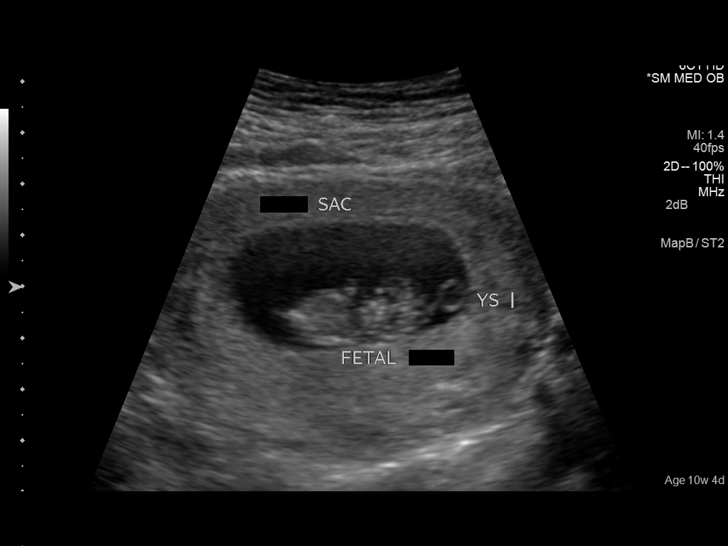
[im 23/55]
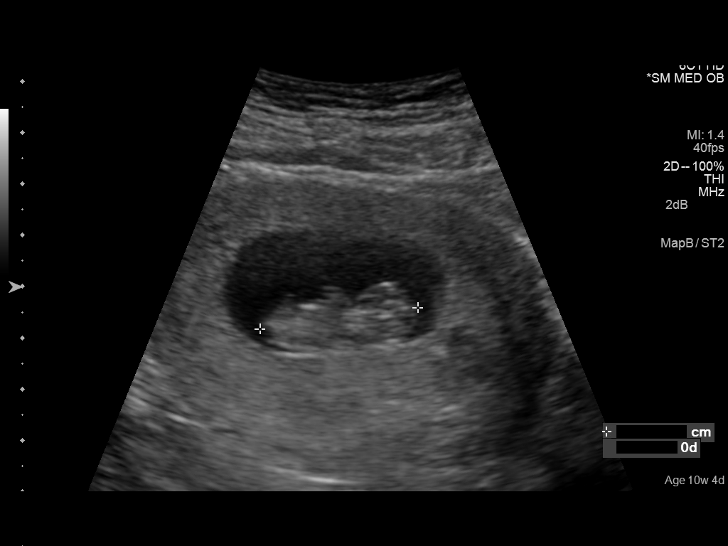
[im 27/55]
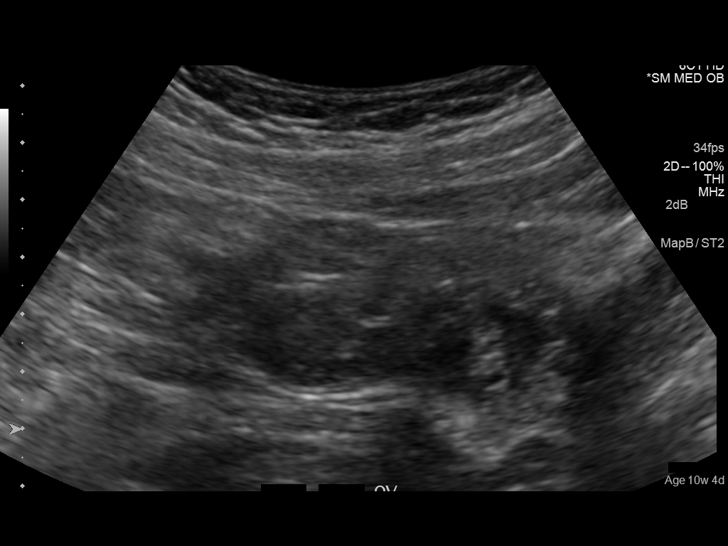
[im 31/55]
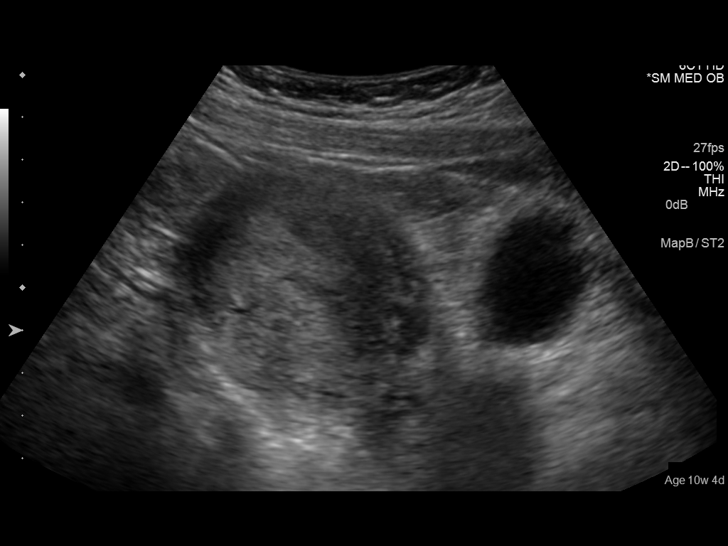
[im 35/55]
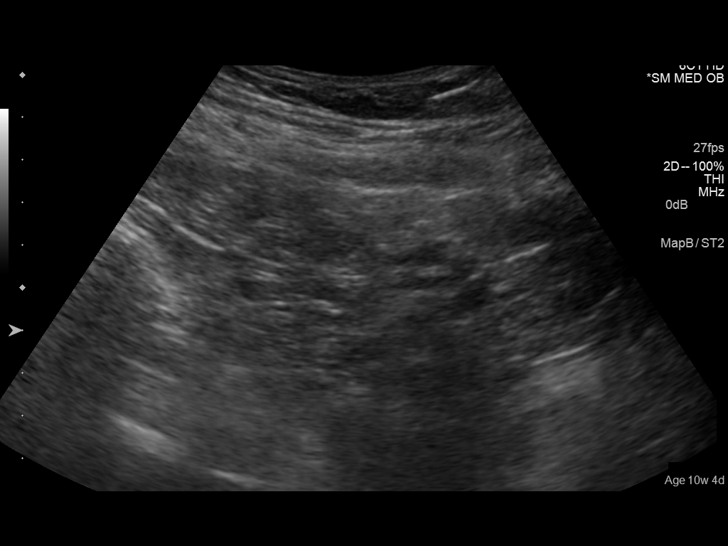
[im 39/55]
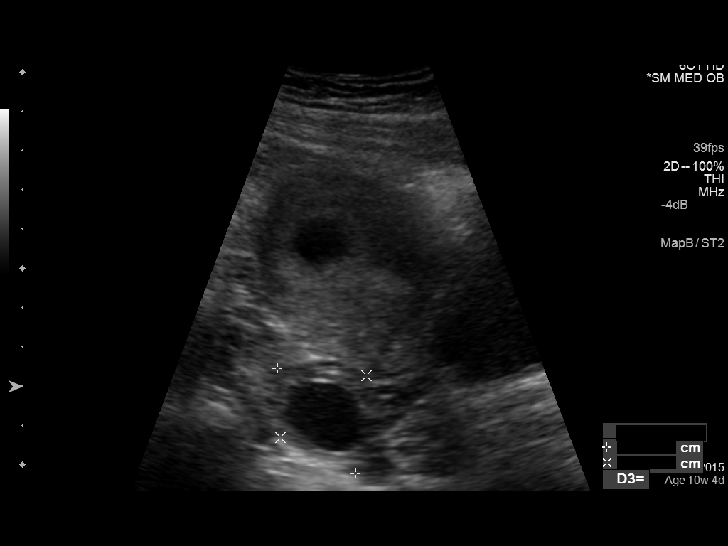
[im 43/55]
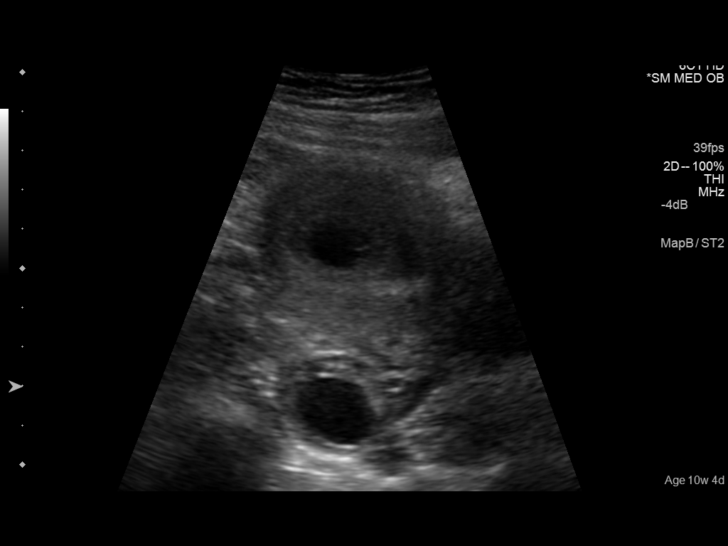
[im 47/55]
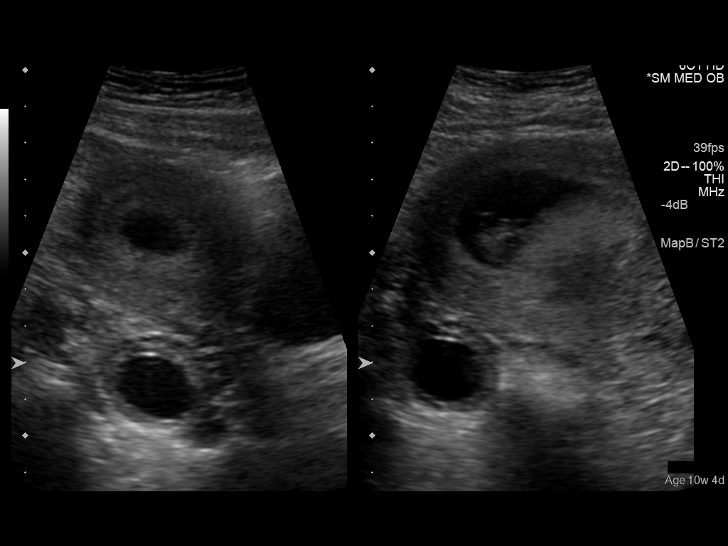
[im 51/55]
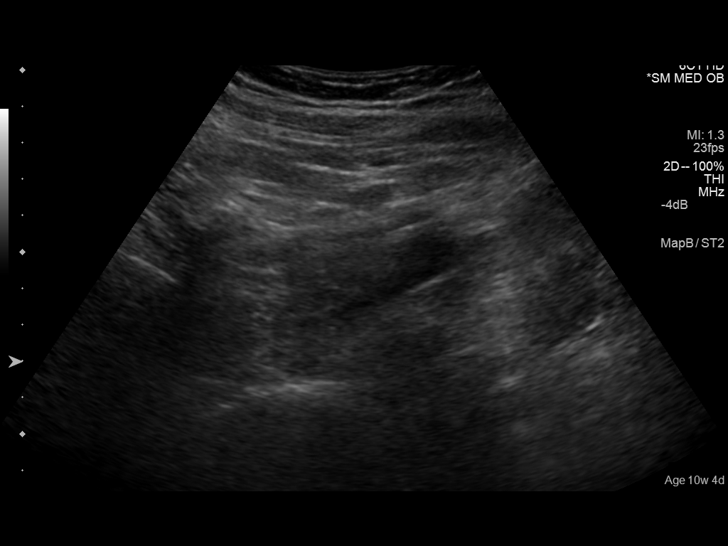
[im 55/55]
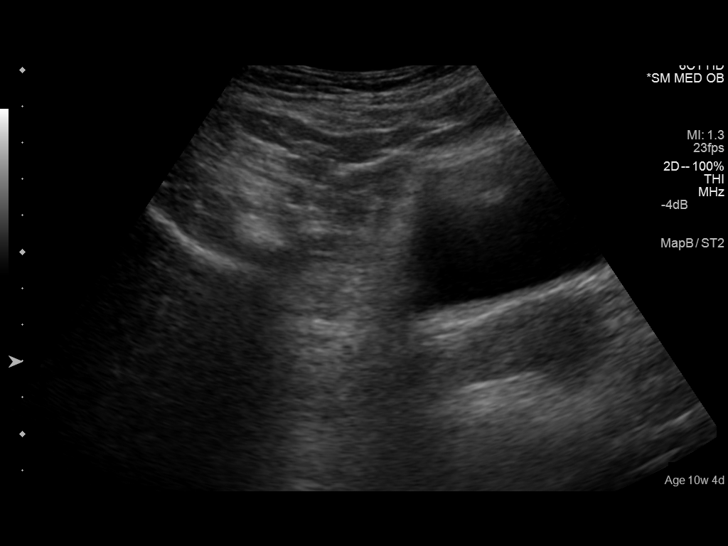

[14 of 28 positions shown; findings below may reference images not displayed]

FINDINGS: Intrauterine gestational sac: Visualized/normal in shape.

Yolk sac:  Present

Embryo:  Present

Cardiac Activity: Present

Heart Rate: 171 bpm

CRL:   31.0  mm   10 w 0 d                  US EDC: 08/22/2015

Maternal uterus/adnexae:

No subchorionic hemorrhage.

Corpus luteal cyst within RIGHT ovary.

Ovaries otherwise unremarkable.

No free pelvic fluid or additional adnexal masses.
IMPRESSION: Single live intrauterine gestational 10 weeks 0 days EGA.

No acute abnormalities.

## 2018-07-17 ENCOUNTER — Other Ambulatory Visit: Payer: Self-pay

## 2018-07-17 ENCOUNTER — Encounter: Payer: Self-pay | Admitting: Internal Medicine

## 2018-07-17 ENCOUNTER — Emergency Department
Admission: EM | Admit: 2018-07-17 | Discharge: 2018-07-17 | Disposition: A | Discharge status: Home / Self Care | Payer: Managed Care, Other (non HMO) | Attending: Emergency Medicine | Admitting: Emergency Medicine

## 2018-07-17 DIAGNOSIS — M62830 Muscle spasm of back: Secondary | ICD-10-CM | POA: Insufficient documentation

## 2018-07-17 DIAGNOSIS — M545 Low back pain, unspecified: Secondary | ICD-10-CM

## 2018-07-17 DIAGNOSIS — Z79899 Other long term (current) drug therapy: Secondary | ICD-10-CM | POA: Insufficient documentation

## 2018-07-17 MED ORDER — KETOROLAC TROMETHAMINE 30 MG/ML IJ SOLN: 30.0000 mg | Freq: Once | INTRAMUSCULAR | Status: DC

## 2018-07-17 MED ORDER — CYCLOBENZAPRINE HCL 10 MG PO TABS
5.0000 mg | ORAL_TABLET | Freq: Once | ORAL | Status: AC
  Administered 2018-07-17: 5 mg via ORAL
  Filled 2018-07-17: qty 1

## 2018-07-17 MED ORDER — KETOROLAC TROMETHAMINE 30 MG/ML IJ SOLN
30.0000 mg | Freq: Once | INTRAMUSCULAR | Status: AC
  Administered 2018-07-17: 30 mg via INTRAMUSCULAR
  Filled 2018-07-17: qty 1

## 2018-07-17 MED ORDER — NAPROXEN 500 MG PO TABS: 500.0000 mg | ORAL_TABLET | Freq: Three times a day (TID) | ORAL | 0 refills | Status: AC

## 2018-07-17 MED ORDER — CYCLOBENZAPRINE HCL 5 MG PO TABS: 5.0000 mg | ORAL_TABLET | Freq: Three times a day (TID) | ORAL | 0 refills | Status: DC | PRN

## 2018-07-17 NOTE — ED Provider Notes (Signed)
[facility] Emergency Department Provider Note ____________________________________________  Time seen: 2200  I have reviewed the triage vital signs and the nursing notes.  HISTORY  Chief Complaint  Back Pain   HPI Wendy Watson is a 27 y.o. female presents to the ER today with complaint of left lower back pain.  She reports the pain started this morning.  She describes the pain as sharp and twisting.  The pain does not radiate.  She denies urinary or vaginal complaints.  Her bowels are moving normally.  She reports a similar episode 2 weeks ago which seemed to resolve without intervention.  She denies any injury to her back.  She has tried Tylenol with minimal relief.  Past Medical History:  Diagnosis Date  . Medical history non-contributory     Patient Active Problem List   Diagnosis Date Noted  . Postpartum care following vaginal delivery (08/22/15) 08/23/2015  . Indication for care or intervention related to labor and delivery 08/22/2015  . Amniotic fluid leaking 06/20/2015  . Indication for care in labor or delivery 05/22/2015  . Abdominal pain 05/06/2015    Past Surgical History:  Procedure Laterality Date  . NO PAST SURGERIES      Prior to Admission medications   Medication Sig Start Date End Date Taking? Authorizing Provider  acetaminophen (TYLENOL) 500 MG tablet Take 500 mg by mouth every 6 (six) hours as needed for headache.    [provider]  cyclobenzaprine (FLEXERIL) 5 MG tablet Take 1 tablet (5 mg total) by mouth 3 (three) times daily as needed for muscle spasms. 07/17/18   Lorre Munroe, NP  naproxen (NAPROSYN) 500 MG tablet Take 1 tablet (500 mg total) by mouth 3 (three) times daily with meals. 07/17/18 07/17/19  Lorre Munroe, NP  Prenatal Vit-Fe Fumarate-FA (PRENATAL MULTIVITAMIN) TABS tablet Take 1 tablet by mouth daily at 12 noon.    [provider]    Allergies Patient has no known allergies.  No family  history on file.  Social History Social History   Tobacco Use  . Smoking status: Never Smoker  . Smokeless tobacco: Never Used  Substance Use Topics  . Alcohol use: No  . Drug use: No    Review of Systems  Constitutional: Negative for fever. Cardiovascular: Negative for chest pain. Respiratory: Negative for shortness of breath. Gastrointestinal: Negative for abdominal pain, vomiting and diarrhea. Genitourinary: Negative for urgency, frequency, dysuria or vaginal complaints. Musculoskeletal: Positive for left lower back pain. Skin: Negative for rash. Neurological: Negative for headaches, focal weakness or numbness. ____________________________________________  PHYSICAL EXAM:  VITAL SIGNS: ED Triage Vitals  Enc Vitals Group     BP 07/17/18 2154 111/65     Pulse Rate 07/17/18 2154 73     Resp 07/17/18 2154 20     Temp 07/17/18 2154 98.7 F (37.1 C)     Temp Source 07/17/18 2154 Oral     SpO2 07/17/18 2154 98 %     Weight 07/17/18 2153 175 lb (79.4 kg)     Height 07/17/18 2153 5\' 2"  (1.575 m)     Head Circumference --      Peak Flow --      Pain Score 07/17/18 2153 10     Pain Loc --      Pain Edu? --      Excl. in GC? --     Constitutional: Alert and oriented. Well appearing and in no distress. Cardiovascular: Normal rate, regular rhythm.  Respiratory:  Normal respiratory effort. No wheezes/rales/rhonchi. Gastrointestinal: No CVA tenderness noted. Musculoskeletal: Decreased flexion and extension of the lumbar spine.  Normal rotation and lateral bending.  No bony tenderness noted over the lumbar spine.  Pain with palpation of the left paralumbar muscles. Neurologic:  Normal gait without ataxia.  Negative SLR bilaterally. Skin:  Skin is warm, dry and intact. No rash noted. ____________________________________________  INITIAL IMPRESSION / ASSESSMENT AND PLAN / ED COURSE  Acute Left Sided Low Back Pain:  No indication for xray at this time Toradol and Flexeril  given in ER eRx for Naproxen 500 mg TID prn with food eRx for Flexeril 5 mg TID prn- sedation caution given Heat may be helpful Back exercises given ____________________________________________  FINAL CLINICAL IMPRESSION(S) / ED DIAGNOSES  Final diagnoses:  Acute left-sided low back pain without sciatica  Muscle spasm of back      Lorre Munroe, NP 07/17/18 2236    Myrna Blazer, MD 07/17/18 [phone]

## 2018-07-17 NOTE — ED Triage Notes (Signed)
Patient reports right lower back pain all day today.  Reports pain worse with movement.

## 2018-07-17 NOTE — Discharge Instructions (Addendum)
You have been diagnosed with acute left sided low back pain, muscles spasms of back. We gave you an RX for Naproxen and Flexeril. Heat may be helpful. Stretching exercises given.

## 2018-09-01 DIAGNOSIS — E669 Obesity, unspecified: Secondary | ICD-10-CM | POA: Insufficient documentation

## 2018-09-01 LAB — HM PAP SMEAR: HM Pap smear: NORMAL

## 2018-09-05 DIAGNOSIS — Z8611 Personal history of tuberculosis: Secondary | ICD-10-CM

## 2018-09-05 HISTORY — DX: Personal history of tuberculosis: Z86.11

## 2019-03-08 DIAGNOSIS — Z8611 Personal history of tuberculosis: Secondary | ICD-10-CM

## 2019-08-15 ENCOUNTER — Emergency Department
Admission: EM | Admit: 2019-08-15 | Discharge: 2019-08-15 | Disposition: A | Discharge status: Home / Self Care | Payer: Self-pay | Attending: Emergency Medicine | Admitting: Emergency Medicine

## 2019-08-15 ENCOUNTER — Other Ambulatory Visit: Payer: Self-pay

## 2019-08-15 DIAGNOSIS — G5602 Carpal tunnel syndrome, left upper limb: Secondary | ICD-10-CM | POA: Insufficient documentation

## 2019-08-15 DIAGNOSIS — X500XXA Overexertion from strenuous movement or load, initial encounter: Secondary | ICD-10-CM | POA: Insufficient documentation

## 2019-08-15 DIAGNOSIS — Z79899 Other long term (current) drug therapy: Secondary | ICD-10-CM | POA: Insufficient documentation

## 2019-08-15 MED ORDER — MELOXICAM 15 MG PO TABS: 15.0000 mg | ORAL_TABLET | Freq: Every day | ORAL | 0 refills | Status: AC

## 2019-08-15 NOTE — ED Triage Notes (Signed)
Presents with left hand pain for about 6 days  Denies any injury

## 2019-08-15 NOTE — ED Provider Notes (Signed)
[facility] Emergency Department Provider Note  ____________________________________________  Time seen: Approximately 5:59 PM  I have reviewed the triage vital signs and the nursing notes.   HISTORY  Chief Complaint Hand Pain    HPI Wendy Watson is a 28 y.o. female who presents the emergency department for complaint of left hand pain.  Patient reports that she does have a history of carpal tunnel syndrome but last symptoms were approximately 10 years ago.  Patient denies any direct trauma to the wrist but pain has been increasing.  She reports that it is a burning/tingling sensation starting in her wrist radiating to her fingers.  No loss of sensation in her fingers.  No evidence of edema or erythema according to the patient.  Patient states that she had an ED visit and was diagnosed with carpal tunnel but she has had 2 days worth of medication with no relief.  Patient is unsure what the medication that she was prescribed is.  Patient reports that symptoms were doing better with rest over the weekend but she returned to work yesterday and had to lift multiple heavy boxes and symptoms increase.  No other complaints at this time.         Past Medical History:  Diagnosis Date  . 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 Medical history non-contributory     Patient Active Problem List   Diagnosis Date Noted  . History of treatment for tuberculosis 09/05/2018  . Obesity, unspecified 09/01/2018  . Postpartum care following vaginal delivery (08/22/15) 08/23/2015  . Indication for care or intervention related to labor and delivery 08/22/2015  . Amniotic fluid leaking 06/20/2015  . Indication for care in labor or delivery 05/22/2015  . Abdominal pain 05/06/2015    Past Surgical History:  Procedure Laterality Date  . NO PAST SURGERIES      Prior to Admission medications   Medication Sig  Start Date End Date Taking? Authorizing Provider  acetaminophen (TYLENOL) 500 MG tablet Take 500 mg by mouth every 6 (six) hours as needed for headache.    [provider]  etonogestrel (NEXPLANON) 68 MG IMPL implant 1 each by Subdermal route once. 10/28/18 10/28/21  Matt Holmes, PA  meloxicam (MOBIC) 15 MG tablet Take 1 tablet (15 mg total) by mouth daily. 08/15/19   Timberlyn Pickford, Delorise Royals, PA-C    Allergies Patient has no known allergies.  No family history on file.  Social History Social History   Tobacco Use  . Smoking status: Never Smoker  . Smokeless tobacco: Never Used  Substance Use Topics  . Alcohol use: No  . Drug use: No     Review of Systems  Constitutional: No fever/chills Eyes: No visual changes. No discharge ENT: No upper respiratory complaints. Cardiovascular: no chest pain. Respiratory: no cough. No SOB. Gastrointestinal: No abdominal pain.  No nausea, no vomiting.   Musculoskeletal: Positive for left wrist/hand pain. Skin: Negative for rash, abrasions, lacerations, ecchymosis. Neurological: Negative for headaches, focal weakness or numbness. 10-point ROS otherwise negative.  ____________________________________________   PHYSICAL EXAM:  VITAL SIGNS: ED Triage Vitals  Enc Vitals Group     BP 08/15/19 1622 113/67     Pulse Rate 08/15/19 1622 84     Resp --      Temp 08/15/19 1622 98.9 F (37.2 C)     Temp Source 08/15/19 1622 Oral     SpO2 08/15/19 1622  99 %     Weight 08/15/19 1632 180 lb (81.6 kg)     Height 08/15/19 1632 5\' 1"  (1.549 m)     Head Circumference --      Peak Flow --      Pain Score 08/15/19 1632 9     Pain Loc --      Pain Edu? --      Excl. in GC? --      Constitutional: Alert and oriented. Well appearing and in no acute distress. Eyes: Conjunctivae are normal. PERRL. EOMI. Head: Atraumatic. ENT:      Ears:       Nose: No congestion/rhinnorhea.      Mouth/Throat: Mucous membranes are moist.  Neck: No  stridor.    Cardiovascular: Normal rate, regular rhythm. Normal S1 and S2.  Good peripheral circulation. Respiratory: Normal respiratory effort without tachypnea or retractions. Lungs CTAB. Good air entry to the bases with no decreased or absent breath sounds. Musculoskeletal: Full range of motion to all extremities. No gross deformities appreciated.  Visualization of the left wrist and left hand reveals no visible signs of trauma.  No erythema, edema, ecchymosis.  Full range of motion to the wrist and all digits left hand.  Patient is very tender to palpation over the carpal tunnel distribution with positive Tinel's and Phalen's.  Sensation and capillary refill intact all digits.  Radial pulse intact. Neurologic:  Normal speech and language. No gross focal neurologic deficits are appreciated.  Skin:  Skin is warm, dry and intact. No rash noted. Psychiatric: Mood and affect are normal. Speech and behavior are normal. Patient exhibits appropriate insight and judgement.   ____________________________________________   LABS (all labs ordered are listed, but only abnormal results are displayed)  Labs Reviewed - No data to display ____________________________________________  EKG   ____________________________________________  RADIOLOGY   No results found.  ____________________________________________    PROCEDURES  Procedure(s) performed:    Procedures    Medications - No data to display   ____________________________________________   INITIAL IMPRESSION / ASSESSMENT AND PLAN / ED COURSE  Pertinent labs & imaging results that were available during my care of the patient were reviewed by me and considered in my medical decision making (see chart for details).  Review of the [place] CSRS was performed in accordance of the [facility] prior to dispensing any controlled drugs.           Patient's diagnosis is consistent with carpal tunnel syndrome.  Patient presented to the  emergency department complaining of left wrist and hand pain.  Patient has a history of carpal tunnel with symptoms being similar to her previous episodes.  Last episode of carpal tunnel syndrome was 10 years ago.  No evidence of any recent injury.  Patient denies any recent injury.  No erythema or edema concerning for infection or gout.  Patient is given Velcro wrist brace, meloxicam for symptom improvement.  Patient is instructed not to use other NSAIDs while taking meloxicam.  Unsure what medication patient had already been prescribed but advised to stop this medication unless other provider had also prescribed meloxicam.  Follow-up with hand surgery if no symptomatic improvement with medication and with splinting.. Patient is given ED precautions to return to the ED for any worsening or new symptoms.     ____________________________________________  FINAL CLINICAL IMPRESSION(S) / ED DIAGNOSES  Final diagnoses:  Carpal tunnel syndrome of left wrist      NEW MEDICATIONS STARTED DURING THIS VISIT:  ED Discharge  Orders         Ordered    meloxicam (MOBIC) 15 MG tablet  Daily     08/15/19 1802              This chart was dictated using voice recognition software/Dragon. Despite best efforts to proofread, errors can occur which can change the meaning. Any change was purely unintentional.    Darletta Moll, PA-C 08/15/19 1803    Nance Pear, MD 08/15/19 [phone]

## 2020-05-28 ENCOUNTER — Emergency Department
Admission: EM | Admit: 2020-05-28 | Discharge: 2020-05-28 | Disposition: A | Discharge status: Home / Self Care | Payer: Self-pay | Attending: Emergency Medicine | Admitting: Emergency Medicine

## 2020-05-28 ENCOUNTER — Emergency Department: Payer: Self-pay

## 2020-05-28 ENCOUNTER — Other Ambulatory Visit: Payer: Self-pay

## 2020-05-28 DIAGNOSIS — M940 Chondrocostal junction syndrome [Tietze]: Secondary | ICD-10-CM | POA: Insufficient documentation

## 2020-05-28 LAB — BASIC METABOLIC PANEL
Anion gap: 11 (ref 5–15)
BUN: 14 mg/dL (ref 6–20)
CO2: 22 mmol/L (ref 22–32)
Calcium: 9.1 mg/dL (ref 8.9–10.3)
Chloride: 104 mmol/L (ref 98–111)
Creatinine, Ser: 0.65 mg/dL (ref 0.44–1.00)
GFR calc Af Amer: >60 mL/min (ref >60)
GFR calc non Af Amer: >60 mL/min (ref >60)
Glucose, Bld: 96 mg/dL (ref 70–99)
Potassium: 3.6 mmol/L (ref 3.5–5.1)
Sodium: 137 mmol/L (ref 135–145)

## 2020-05-28 LAB — CBC
HCT: 39 % (ref 36.0–46.0)
Hemoglobin: 12.6 g/dL (ref 12.0–15.0)
MCH: 26.8 pg (ref 26.0–34.0)
MCHC: 32.3 g/dL (ref 30.0–36.0)
MCV: 83 fL (ref 80.0–100.0)
Platelets: 342 10*3/uL (ref 150–400)
RBC: 4.7 MIL/uL (ref 3.87–5.11)
RDW: 14.1 % (ref 11.5–15.5)
WBC: 9.1 10*3/uL (ref 4.0–10.5)
nRBC: 0 % (ref 0.0–0.2)

## 2020-05-28 LAB — TROPONIN I (HIGH SENSITIVITY): Troponin I (High Sensitivity): 4 ng/L (ref <18)

## 2020-05-28 MED ORDER — SODIUM CHLORIDE 0.9% FLUSH: 3.0000 mL | Freq: Once | INTRAVENOUS | Status: DC

## 2020-05-28 MED ORDER — NAPROXEN SODIUM 220 MG PO TABS: 220.0000 mg | ORAL_TABLET | Freq: Two times a day (BID) | ORAL | 0 refills | Status: AC

## 2020-05-28 NOTE — ED Notes (Signed)
Pt ambulatory from triage in NAD, skin warm and dry, pt speaking in complete sentences without distress. Pt reports pressure in central chest x 1 month but reports pain worsened today and is worse when she bends forward. Pt reports mild shob.

## 2020-05-28 NOTE — ED Triage Notes (Signed)
First nurse note- here for chest pain. Pulled for EKG 

## 2020-05-28 NOTE — ED Provider Notes (Signed)
[facility] Emergency Department Provider Note   ____________________________________________    I have reviewed the triage vital signs and the nursing notes.   HISTORY  Chief Complaint Chest Pain     HPI Wendy Watson is a 29 y.o. female who presents with complaints of chest pain.  Patient reports over the last month she has had mild discomfort to her bilateral lower sternum which is worse with palpation.  Today she was at work lifting a heavy box and she had a brief sharp pain.  She states she felt better quickly but her coworkers insisted that she come to the emergency department.  Currently she feels quite well and has no complaints.  No shortness of breath.  Non-smoker.  Has received both Covid vaccines.  No calf pain or swelling.  No nausea or vomiting.  No fevers chills or diaphoresis.  Has not taken anything for this  Past Medical History:  Diagnosis Date  . 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 Medical history non-contributory     Patient Active Problem List   Diagnosis Date Noted  . History of treatment for tuberculosis 09/05/2018  . Obesity, unspecified 09/01/2018  . Postpartum care following vaginal delivery (08/22/15) 08/23/2015  . Indication for care or intervention related to labor and delivery 08/22/2015  . Amniotic fluid leaking 06/20/2015  . Indication for care in labor or delivery 05/22/2015  . Abdominal pain 05/06/2015    Past Surgical History:  Procedure Laterality Date  . NO PAST SURGERIES      Prior to Admission medications   Medication Sig Start Date End Date Taking? Authorizing Provider  acetaminophen (TYLENOL) 500 MG tablet Take 500 mg by mouth every 6 (six) hours as needed for headache.    [provider]  etonogestrel (NEXPLANON) 68 MG IMPL implant 1 each by Subdermal route once. 10/28/18 10/28/21  Matt Holmes, PA  meloxicam  (MOBIC) 15 MG tablet Take 1 tablet (15 mg total) by mouth daily. 08/15/19   Cuthriell, Delorise Royals, PA-C  naproxen sodium (ALEVE) 220 MG tablet Take 1 tablet (220 mg total) by mouth 2 (two) times daily with a meal. 05/28/20   Jene Every, MD     Allergies Patient has no known allergies.  No family history on file.  Social History Social History   Tobacco Use  . Smoking status: Never Smoker  . Smokeless tobacco: Never Used  Substance Use Topics  . Alcohol use: No  . Drug use: No    Review of Systems  Constitutional: No fever/chills Eyes: No visual changes.  ENT: No neck pain Cardiovascular: As above Respiratory: Denies shortness of breath. Gastrointestinal: No abdominal pain.  No nausea, no vomiting.   Genitourinary: Negative for dysuria. Musculoskeletal: Negative for back pain. Skin: Negative for rash. Neurological: Negative for headaches or weakness   ____________________________________________   PHYSICAL EXAM:  VITAL SIGNS: ED Triage Vitals  Enc Vitals Group     BP 05/28/20 1225 115/81     Pulse Rate 05/28/20 1225 72     Resp 05/28/20 1225 19     Temp 05/28/20 1225 98 F (36.7 C)     Temp src --      SpO2 05/28/20 1225 100 %     Weight 05/28/20 1226 82.6 kg (182 lb)     Height 05/28/20 1226 1.549 m (5\' 1" )     Head Circumference --  Peak Flow --      Pain Score 05/28/20 1226 9     Pain Loc --      Pain Edu? --      Excl. in GC? --     Constitutional: Alert and oriented.  Nose: No congestion/rhinnorhea. Mouth/Throat: Mucous membranes are moist.    Cardiovascular: Normal rate, regular rhythm. Grossly normal heart sounds.  Good peripheral circulation.  Point tenderness to the inferior sternum along the bilateral borders, no redness or rash or bruising Respiratory: Normal respiratory effort.  No retractions. Lungs CTAB. Gastrointestinal: Soft and nontender. No distention.    Musculoskeletal: No lower extremity tenderness nor edema.  Warm and well  perfused Neurologic:  Normal speech and language. No gross focal neurologic deficits are appreciated.  Skin:  Skin is warm, dry and intact. No rash noted. Psychiatric: Mood and affect are normal. Speech and behavior are normal.  ____________________________________________   LABS (all labs ordered are listed, but only abnormal results are displayed)  Labs Reviewed  BASIC METABOLIC PANEL  CBC  POC URINE PREG, ED  TROPONIN I (HIGH SENSITIVITY)   ____________________________________________  EKG  ED ECG REPORT I, Jene Every, the attending physician, personally viewed and interpreted this ECG.  Date: 05/28/2020  Rhythm: normal sinus rhythm QRS Axis: normal Intervals: normal ST/T Wave abnormalities: normal Narrative Interpretation: no evidence of acute ischemia  ____________________________________________  RADIOLOGY  Chest x-ray reviewed by me, no infiltrate, no pneumonia, unremarkable ____________________________________________   PROCEDURES  Procedure(s) performed: No  Procedures   Critical Care performed: No ____________________________________________   INITIAL IMPRESSION / ASSESSMENT AND PLAN / ED COURSE  Pertinent labs & imaging results that were available during my care of the patient were reviewed by me and considered in my medical decision making (see chart for details).  Patient presents with chest pain as detailed above.  Differential includes costochondritis/chest wall pain, less likely pneumonia, not consistent with PE or ACS  EKG is quite reassuring, high sensitive troponin is unremarkable.  Lab work is normal with normal white blood cell count.  Chest x-ray overall unremarkable.  Given exam consistent with costochondritis.  Have recommended Naprosyn twice daily, outpatient follow-up as needed.  Return precautions discussed    ____________________________________________   FINAL CLINICAL IMPRESSION(S) / ED DIAGNOSES  Final diagnoses:    Costochondritis        Note:  This document was prepared using Dragon voice recognition software and may include unintentional dictation errors.   Jene Every, MD 05/28/20 1358
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
## 2020-05-28 NOTE — ED Triage Notes (Signed)
Pt comes via POV from home with c/o mid sternal CP. Pt states no radiation and that it started this am.  Pt states some SOB

## 2021-10-23 ENCOUNTER — Ambulatory Visit (LOCAL_COMMUNITY_HEALTH_CENTER): Payer: Self-pay | Admitting: Nurse Practitioner

## 2021-10-23 ENCOUNTER — Other Ambulatory Visit: Payer: Self-pay

## 2021-10-23 ENCOUNTER — Encounter: Payer: Self-pay | Admitting: Nurse Practitioner

## 2021-10-23 VITALS — BP 113/77 | Ht 61.0 in | Wt 186.4 lb

## 2021-10-23 DIAGNOSIS — R519 Headache, unspecified: Secondary | ICD-10-CM | POA: Insufficient documentation

## 2021-10-23 DIAGNOSIS — Z01419 Encounter for gynecological examination (general) (routine) without abnormal findings: Secondary | ICD-10-CM

## 2021-10-23 DIAGNOSIS — Z3009 Encounter for other general counseling and advice on contraception: Secondary | ICD-10-CM

## 2021-10-23 LAB — WET PREP FOR TRICH, YEAST, CLUE
Trichomonas Exam: NEGATIVE
Yeast Exam: NEGATIVE

## 2021-10-23 NOTE — Progress Notes (Signed)
[facility] DEPARTMENT [facility] [address] Main Number: [phone]    Family Planning Visit- Initial Visit  Subjective:  Wendy Watson is a 30 y.o.  [id number]   being seen today for an initial annual visit and to discuss reproductive life planning.  The patient is currently using Hormonal Implant for pregnancy prevention. Patient reports   does not want a pregnancy in the next year.  Patient has the following medical conditions has Abdominal pain; Indication for care in labor or delivery; Amniotic fluid leaking; Indication for care or intervention related to labor and delivery; Postpartum care following vaginal delivery (08/22/15); History of treatment for tuberculosis; Obesity, unspecified; and Frequent headaches on their problem list.  Chief Complaint  Patient presents with   Annual Exam    PE and possible Nexplanon removal/insertion    Patient reports to clinic today for a physical and Nexplanon removal and reinsertion.  Patient had Nexplanon insertion on 10/28/2018.  Last PAP 09/01/2018 that was NIL, no HPV.  Last LMP 10/02/2021.   Patient denies current signs and symptoms    Body mass index is 35.22 kg/m. - Patient is eligible for diabetes screening based on BMI and age >62?  no HA1C ordered? no  Patient reports 1  partner/s in last year. Desires STI screening?  Yes  Has patient been screened once for HCV in the past?  No    Does the patient have current drug use (including MJ), have a partner with drug use, and/or has been incarcerated since last result? No  If yes-- Screen for HCV through [facility]   Does the patient meet criteria for HBV testing? No  Criteria:  -Household, sexual or needle sharing contact with HBV -History of drug use -HIV positive -Those with known Hep C   Health Maintenance Due  Topic Date Due   COVID-19 Vaccine (1) Never done   Hepatitis C Screening  Never done   TETANUS/TDAP  Never done    INFLUENZA VACCINE  Never done   PAP SMEAR-Modifier  09/01/2021    Review of Systems  Constitutional:  Negative for chills, diaphoresis, fever, malaise/fatigue and weight loss.  HENT:  Negative for hearing loss and sore throat.   Eyes:  Negative for blurred vision and double vision.  Respiratory:  Negative for shortness of breath.   Cardiovascular:  Negative for chest pain and leg swelling.  Gastrointestinal:  Negative for abdominal pain, constipation, diarrhea, nausea and vomiting.  Genitourinary:  Negative for dysuria, frequency and urgency.  Musculoskeletal:  Negative for joint pain and myalgias.  Skin:  Negative for itching and rash.  Neurological:  Negative for dizziness and headaches.  Endo/Heme/Allergies:  Does not bruise/bleed easily.  Psychiatric/Behavioral:  Negative for depression, substance abuse and suicidal ideas. The patient is not nervous/anxious.    The following portions of the patient's history were reviewed and updated as appropriate: allergies, current medications, past family history, past medical history, past social history, past surgical history and problem list. Problem list updated.   See flowsheet for other program required questions.  Objective:   Vitals:   10/23/21 1405  BP: 113/77  Weight: 186 lb 6.4 oz (84.6 kg)  Height: 5\' 1"  (1.549 m)    Physical Exam Vitals and nursing note reviewed.  Constitutional:      Appearance: Normal appearance.  HENT:     Head: Normocephalic and atraumatic.     Right Ear: Tympanic membrane normal.     Left Ear: Tympanic  membrane normal.     Mouth/Throat:     Mouth: Mucous membranes are moist.     Pharynx: No oropharyngeal exudate or posterior oropharyngeal erythema.     Comments: Dental caries noted.  Eyes:     General: No scleral icterus. Cardiovascular:     Rate and Rhythm: Normal rate and regular rhythm.     Pulses: Normal pulses.  Pulmonary:     Effort: Pulmonary effort is normal.     Breath sounds:  Normal breath sounds.  Chest:     Comments: Breasts:        Right: Normal. No swelling, mass, nipple discharge, skin change or tenderness.        Left: Normal. No swelling, mass, nipple discharge, skin change or tenderness.   Abdominal:     General: Abdomen is flat. Bowel sounds are normal.     Palpations: Abdomen is soft.  Genitourinary:       Comments: External genitalia/pubic area without nits, lice, edema, erythema, lesions and inguinal adenopathy. Vagina with normal mucosa and discharge. Nabothian cyst noted at 5:00 o'clock and 10:00 o'clock.  Uterus firm, mobile, nt, no masses, no CMT, no adnexal tenderness or fullness.  Musculoskeletal:        General: Normal range of motion.     Cervical back: Full passive range of motion without pain and normal range of motion.  Skin:    General: Skin is warm and dry.  Neurological:     General: No focal deficit present.     Mental Status: She is alert.  Psychiatric:        Behavior: Behavior is cooperative.      Assessment and Plan:  Wendy Watson is a 30 y.o. female presenting to the [facility] Department for an initial annual wellness/contraceptive visit  Contraception counseling: Reviewed all forms of birth control options in the tiered based approach. available including abstinence; over the counter/barrier methods; hormonal contraceptive medication including pill, patch, ring, injection,contraceptive implant, ECP; hormonal and nonhormonal IUDs; permanent sterilization options including vasectomy and the various tubal sterilization modalities. Risks, benefits, and typical effectiveness rates were reviewed.  Questions were answered.  Written information was also given to the patient to review.  Patient desires Hormonal Implant, this was prescribed for patient.    The patient will follow up in 1 year  for surveillance.  The patient was told to call with any further questions, or with any concerns about this method  of contraception.  Emphasized use of condoms 100% of the time for STI prevention.  Patient was not offered ECP based on use of contraception.    1. Family planning -30 year old female seen in clinic today for physical, STD screening, and Nexplanon removal and reinsertion.  Patient informed that Nexplanon was good for one more year.  Patient in agreement to wait until next year for removal and reinsertion.   -PAP performed today.  -Patient accepted all screenings including oral, vaginal CT/GC and bloodwork for HIV/RPR.  Patient meets criteria for HepB screening? No. Ordered? No - low risk  Patient meets criteria for HepC screening? No. Ordered? No - low risk   Treat wet prep per standing order  Discussed time line for [facility] results and that patient will be called with positive results and encouraged patient to call if she had not heard in 2 weeks.  Counseled to return or seek care for continued or worsening symptoms Recommended condom use with all sex  Patient is currently using *  Nexplanon to prevent pregnancy.   - WET PREP FOR TRICH, YEAST, CLUE - Gonococcus culture - [facility] - Syphilis Serology, [facility] - Chlamydia/Gonorrhea [facility] - IGP, Aptima HPV   2. Well woman exam -Normal well woman exam.  CBE done today.         Return in about 1 year (around 10/23/2022) for Annual well-woman exam.    Glenna Fellows, FNP

## 2021-10-23 NOTE — Progress Notes (Signed)
Pt here for PE.  She will wait until 10/2022 to have Nexplanon removed and reinserted.  Wet mount results reviewed, no treatment required.  Pt declined condoms. Berdie Ogren, RN

## 2021-10-24 NOTE — Progress Notes (Signed)
No nexplanon removal and reinsertion, see same day note.

## 2021-10-27 LAB — IGP, APTIMA HPV
HPV Aptima: NEGATIVE
PAP Smear Comment: 0

## 2021-10-27 LAB — GONOCOCCUS CULTURE

## 2023-04-08 ENCOUNTER — Emergency Department
Admission: EM | Admit: 2023-04-08 | Discharge: 2023-04-08 | Disposition: A | Discharge status: Home / Self Care | Payer: Self-pay | Attending: Emergency Medicine | Admitting: Emergency Medicine

## 2023-04-08 ENCOUNTER — Encounter: Payer: Self-pay | Admitting: Emergency Medicine

## 2023-04-08 ENCOUNTER — Other Ambulatory Visit: Payer: Self-pay

## 2023-04-08 DIAGNOSIS — M545 Low back pain, unspecified: Secondary | ICD-10-CM | POA: Insufficient documentation

## 2023-04-08 LAB — URINALYSIS, ROUTINE W REFLEX MICROSCOPIC
Bilirubin Urine: NEGATIVE
Glucose, UA: NEGATIVE mg/dL
Ketones, ur: NEGATIVE mg/dL
Leukocytes,Ua: NEGATIVE
Nitrite: NEGATIVE
Protein, ur: NEGATIVE mg/dL
Specific Gravity, Urine: 1.026 (ref 1.005–1.030)
pH: 5 (ref 5.0–8.0)

## 2023-04-08 LAB — POC URINE PREG, ED: Preg Test, Ur: NEGATIVE

## 2023-04-08 MED ORDER — KETOROLAC TROMETHAMINE 30 MG/ML IJ SOLN
30.0000 mg | Freq: Once | INTRAMUSCULAR | Status: AC
  Administered 2023-04-08: 30 mg via INTRAMUSCULAR
  Filled 2023-04-08: qty 1

## 2023-04-08 MED ORDER — ACETAMINOPHEN 500 MG PO TABS
1000.0000 mg | ORAL_TABLET | Freq: Once | ORAL | Status: AC
  Administered 2023-04-08: 1000 mg via ORAL
  Filled 2023-04-08: qty 2

## 2023-04-08 MED ORDER — LIDOCAINE 5 % EX PTCH
1.0000 | MEDICATED_PATCH | CUTANEOUS | Status: DC
  Administered 2023-04-08: 1 via TRANSDERMAL
  Filled 2023-04-08: qty 1

## 2023-04-08 MED ORDER — LIDOCAINE 5 % EX PTCH: 1.0000 | MEDICATED_PATCH | Freq: Two times a day (BID) | CUTANEOUS | 0 refills | Status: AC

## 2023-04-08 NOTE — ED Notes (Signed)
See triage note  Presents with lower back pain  States pain started on Sunday   Pain increases with movement  Denies any injury

## 2023-04-08 NOTE — ED Provider Notes (Signed)
[facility] Provider Note    Event Date/Time   First MD Initiated Contact with Patient 04/08/23 [phone]     (approximate)   History   Back Pain   HPI  Vernella Friday Begnoche is a 32 y.o. female   Past medical history of no significant past medical history presents with lower back pain.  No obvious trauma or inciting event.  No associated GU symptoms.  No abdominal pain nausea vomiting diarrhea.  Has had similar symptoms in the past that usually gets a anti-inflammatory shot and feels better.  Denies any red flag symptoms of low back pain like incontinence motor or sensory changes fever  Independent Historian contributed to assessment above: Her husband at bedside corroborates information given above      Physical Exam   Triage Vital Signs: ED Triage Vitals  Enc Vitals Group     BP 04/08/23 0751 98/87     Pulse Rate 04/08/23 0751 79     Resp 04/08/23 0751 18     Temp 04/08/23 0751 98.4 F (36.9 C)     Temp Source 04/08/23 0751 Oral     SpO2 04/08/23 0751 99 %     Weight 04/08/23 0816 186 lb 8.2 oz (84.6 kg)     Height 04/08/23 0816 5' (1.524 m)     Head Circumference --      Peak Flow --      Pain Score 04/08/23 0751 10     Pain Loc --      Pain Edu? --      Excl. in GC? --     Most recent vital signs: Vitals:   04/08/23 0751  BP: 98/87  Pulse: 79  Resp: 18  Temp: 98.4 F (36.9 C)  SpO2: 99%    General: Awake, no distress.  CV:  Good peripheral perfusion.  Resp:  Normal effort.  Abd:  No distention.  Other:  No significant tenderness to palpation along the lower back midline or paraspinal she points to the tailbone where she is having most of her pain.  She is ambulatory.  Motor or sensory exam normal to bilateral lower extremities.  Abdomen soft nontender appears well   ED Results / Procedures / Treatments   Labs (all labs ordered are listed, but only abnormal results are displayed) Labs Reviewed  URINALYSIS, ROUTINE W REFLEX  MICROSCOPIC - Abnormal; Notable for the following components:      Result Value   Color, Urine YELLOW (*)    APPearance CLEAR (*)    Hgb urine dipstick SMALL (*)    Bacteria, UA RARE (*)    All other components within normal limits  POC URINE PREG, ED     I ordered and reviewed the above labs they are notable for negative pregnancy test and rare bacteria with no inflammatory cells in the urine PROCEDURES:  Critical Care performed: No  Procedures   MEDICATIONS ORDERED IN ED: Medications  lidocaine (LIDODERM) 5 % 1 patch (1 patch Transdermal Patch Applied 04/08/23 0919)  ketorolac (TORADOL) 30 MG/ML injection 30 mg (30 mg Intramuscular Given 04/08/23 0919)  acetaminophen (TYLENOL) tablet 1,000 mg (1,000 mg Oral Given 04/08/23 0919)    IMPRESSION / MDM / ASSESSMENT AND PLAN / ED COURSE  I reviewed the triage vital signs and the nursing notes.  Patient's presentation is most consistent with acute presentation with potential threat to life or bodily function.  Differential diagnosis includes, but is not limited to, lumbar strain, sacroiliitis, cord compression less likely, urinary tract infection less likely, renal colic less likely, AAA less likely  MDM: Lumbar strain or sacroiliitis most likely in this patient with quality or location of pain.  Negative pregnancy test and no signs of urinary tract infection, doubt more emergent pathologies like cord compression given no red flag signs, fracture or dislocation given atraumatic, intra-abdominal infection given benign abdominal exam.  Give treatment with Toradol, Tylenol, Lidoderm and have her follow-up with PMD return with any worsening.       FINAL CLINICAL IMPRESSION(S) / ED DIAGNOSES   Final diagnoses:  Acute bilateral low back pain without sciatica     Rx / DC Orders   ED Discharge Orders          Ordered    Ambulatory Referral to Primary Care (Establish Care)        04/08/23 0938     lidocaine (LIDODERM) 5 %  Every 12 hours        04/08/23 0939             Note:  This document was prepared using Dragon voice recognition software and may include unintentional dictation errors.    Pilar Jarvis, MD 04/08/23 [id number]

## 2023-04-08 NOTE — ED Triage Notes (Signed)
Patient to ED via POV for lower back pain that started Sunday night. Worse when moving, no known injury.

## 2023-04-08 NOTE — Discharge Instructions (Signed)
Tome paracetamol 650 mg e ibuprofeno 400 mg cada 6 horas para el dolor. Tomar con la comida. Llame a su mdico de cabecera esta semana para un chequeo. Si tiene algn sntoma nuevo, que empeora o inesperado, regrese al departamento de emergencias para una reevaluacin.

## 2024-03-14 ENCOUNTER — Other Ambulatory Visit: Payer: Self-pay

## 2024-03-14 ENCOUNTER — Encounter: Payer: Self-pay | Admitting: Emergency Medicine

## 2024-03-14 ENCOUNTER — Emergency Department
Admission: EM | Admit: 2024-03-14 | Discharge: 2024-03-15 | Disposition: A | Discharge status: Home / Self Care | Attending: Emergency Medicine | Admitting: Emergency Medicine

## 2024-03-14 DIAGNOSIS — R1031 Right lower quadrant pain: Secondary | ICD-10-CM | POA: Diagnosis not present

## 2024-03-14 DIAGNOSIS — D72829 Elevated white blood cell count, unspecified: Secondary | ICD-10-CM | POA: Insufficient documentation

## 2024-03-14 DIAGNOSIS — R112 Nausea with vomiting, unspecified: Secondary | ICD-10-CM | POA: Diagnosis present

## 2024-03-14 DIAGNOSIS — R911 Solitary pulmonary nodule: Secondary | ICD-10-CM | POA: Insufficient documentation

## 2024-03-14 LAB — COMPREHENSIVE METABOLIC PANEL WITH GFR
ALT: 13 U/L (ref 0–44)
AST: 17 U/L (ref 15–41)
Albumin: 4.2 g/dL (ref 3.5–5.0)
Alkaline Phosphatase: 67 U/L (ref 38–126)
Anion gap: 10 (ref 5–15)
BUN: 17 mg/dL (ref 6–20)
CO2: 24 mmol/L (ref 22–32)
Calcium: 8.9 mg/dL (ref 8.9–10.3)
Chloride: 103 mmol/L (ref 98–111)
Creatinine, Ser: 0.62 mg/dL (ref 0.44–1.00)
GFR, Estimated: >60 mL/min (ref >60)
Glucose, Bld: 100 mg/dL — ABNORMAL HIGH (ref 70–99)
Potassium: 3.7 mmol/L (ref 3.5–5.1)
Sodium: 137 mmol/L (ref 135–145)
Total Bilirubin: 0.4 mg/dL (ref 0.0–1.2)
Total Protein: 7.5 g/dL (ref 6.5–8.1)

## 2024-03-14 LAB — CBC WITH DIFFERENTIAL/PLATELET
Abs Immature Granulocytes: 0.04 10*3/uL (ref 0.00–0.07)
Basophils Absolute: 0.1 10*3/uL (ref 0.0–0.1)
Basophils Relative: 1 %
Eosinophils Absolute: 0.4 10*3/uL (ref 0.0–0.5)
Eosinophils Relative: 3 %
HCT: 39.6 % (ref 36.0–46.0)
Hemoglobin: 13.1 g/dL (ref 12.0–15.0)
Immature Granulocytes: 0 %
Lymphocytes Relative: 36 %
Lymphs Abs: 4.1 10*3/uL — ABNORMAL HIGH (ref 0.7–4.0)
MCH: 27.6 pg (ref 26.0–34.0)
MCHC: 33.1 g/dL (ref 30.0–36.0)
MCV: 83.4 fL (ref 80.0–100.0)
Monocytes Absolute: 0.6 10*3/uL (ref 0.1–1.0)
Monocytes Relative: 5 %
Neutro Abs: 6.2 10*3/uL (ref 1.7–7.7)
Neutrophils Relative %: 55 %
Platelets: 393 10*3/uL (ref 150–400)
RBC: 4.75 MIL/uL (ref 3.87–5.11)
RDW: 14 % (ref 11.5–15.5)
WBC: 11.5 10*3/uL — ABNORMAL HIGH (ref 4.0–10.5)
nRBC: 0 % (ref 0.0–0.2)

## 2024-03-14 LAB — URINALYSIS, ROUTINE W REFLEX MICROSCOPIC
Bilirubin Urine: NEGATIVE
Glucose, UA: NEGATIVE mg/dL
Ketones, ur: NEGATIVE mg/dL
Leukocytes,Ua: NEGATIVE
Nitrite: NEGATIVE
Protein, ur: NEGATIVE mg/dL
Specific Gravity, Urine: 1.018 (ref 1.005–1.030)
pH: 6 (ref 5.0–8.0)

## 2024-03-14 LAB — RESP PANEL BY RT-PCR (RSV, FLU A&B, COVID)  RVPGX2
Influenza A by PCR: NEGATIVE
Influenza B by PCR: NEGATIVE
Resp Syncytial Virus by PCR: NEGATIVE
SARS Coronavirus 2 by RT PCR: NEGATIVE

## 2024-03-14 LAB — POC URINE PREG, ED: Preg Test, Ur: NEGATIVE

## 2024-03-14 LAB — LIPASE, BLOOD: Lipase: 31 U/L (ref 11–51)

## 2024-03-14 NOTE — ED Triage Notes (Signed)
  Patient comes in with abdominal pain and emesis that started earlier today.  Patient states she went to work and felt not 100%, tried eating lunch and started having abdominal pain with 1 episode of emesis.  Afterwards she endorses chills and productive cough.  Pain 8/10, sharp.

## 2024-03-14 NOTE — ED Provider Notes (Signed)
 [facility] Provider Note    Event Date/Time   First MD Initiated Contact with Patient 03/14/24 2324     (approximate)   History   Abdominal Pain and Emesis   HPI  Wendy Watson is a 33 y.o. female with no significant past medical history presents to the emergency department with abdominal pain, nausea and vomiting that started today.  No diarrhea, dysuria or hematuria, vaginal bleeding or discharge.  States most of her pain is around the umbilicus.   History provided by patient, husband.    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 Authorizing Provider  acetaminophen  (TYLENOL ) 500 MG tablet Take 500 mg by mouth every 6 (six) hours as needed for headache.    [provider]  etonogestrel (NEXPLANON) 68 MG IMPL implant 1 each by Subdermal route once. 10/28/18 10/28/21  Roz Cornelia, PA  meloxicam  (MOBIC ) 15 MG tablet Take 1 tablet (15 mg total) by mouth daily. Patient not taking: Reported on 10/23/2021 08/15/19   Cuthriell, Ardath Bears, PA-C  naproxen  sodium (ALEVE ) 220 MG tablet Take 1 tablet (220 mg total) by mouth 2 (two) times daily with a meal. Patient not taking: Reported on 10/23/2021 05/28/20   Bryson Carbine, MD    Physical Exam   Triage Vital Signs: ED Triage Vitals  Encounter Vitals Group     BP 03/14/24 1934 117/84     Systolic BP Percentile --      Diastolic BP Percentile --      Pulse Rate 03/14/24 1934 91     Resp 03/14/24 1934 20     Temp 03/14/24 1934 98.4 F (36.9 C)     Temp Source 03/14/24 1934 Oral     SpO2 03/14/24 1934 100 %     Weight 03/14/24 1947 186 lb (84.4 kg)      Height 03/14/24 1947 5' (1.524 m)     Head Circumference --      Peak Flow --      Pain Score 03/14/24 1947 8     Pain Loc --      Pain Education --      Exclude from Growth Chart --     Most recent vital signs: Vitals:   03/15/24 0045 03/15/24 0142  BP: 109/74 102/73  Pulse: 76 69  Resp: 18 18  Temp:    SpO2: 100% 99%    CONSTITUTIONAL: Alert, responds appropriately to questions. Well-appearing; well-nourished HEAD: Normocephalic, atraumatic EYES: Conjunctivae clear, pupils appear equal, sclera nonicteric ENT: normal nose; moist mucous membranes NECK: Supple, normal ROM CARD: RRR; S1 and S2 appreciated RESP: Normal chest excursion without splinting or tachypnea; breath sounds clear and equal bilaterally; no wheezes, no rhonchi, no rales, no hypoxia or respiratory distress, speaking full sentences ABD/GI: Non-distended; soft, tender in the right lower quadrant, no tenderness in the right upper quadrant, negative Murphy sign, no guarding or rebound BACK: The back appears normal EXT: Normal ROM in all joints; no deformity noted, no edema SKIN: Normal color for age and race; warm; no rash on exposed skin NEURO: Moves all extremities equally, normal speech  PSYCH: The patient's mood and manner are appropriate.   ED Results / Procedures / Treatments   LABS: (all labs ordered are listed, but only abnormal results are displayed) Labs Reviewed  URINALYSIS, ROUTINE W REFLEX MICROSCOPIC - Abnormal; Notable for the following components:      Result Value   Color, Urine STRAW (*)    APPearance CLEAR (*)    Hgb urine dipstick SMALL (*)    Bacteria, UA RARE (*)    All other components within normal limits  CBC WITH DIFFERENTIAL/PLATELET - Abnormal; Notable for the following components:   WBC 11.5 (*)    Lymphs Abs 4.1 (*)    All other components within normal limits  COMPREHENSIVE METABOLIC PANEL WITH GFR - Abnormal; Notable for the following components:   Glucose, Bld 100 (*)     All other components within normal limits  RESP PANEL BY RT-PCR (RSV, FLU A&B, COVID)  RVPGX2  LIPASE, BLOOD  POC URINE PREG, ED     EKG:  EKG Interpretation Date/Time:    Ventricular Rate:    PR Interval:    QRS Duration:    QT Interval:    QTC Calculation:   R Axis:      Text Interpretation:           RADIOLOGY: My personal review and interpretation of imaging: CT scan shows normal appendix.  No gallstones.  I have personally reviewed all radiology reports.   CT ABDOMEN PELVIS W CONTRAST Result Date: 03/15/2024 CLINICAL DATA:  RLQ abdominal pain EXAM: CT ABDOMEN AND PELVIS WITH CONTRAST TECHNIQUE: Multidetector CT imaging of the abdomen and pelvis was performed using the standard protocol following bolus administration of intravenous contrast. RADIATION DOSE REDUCTION: This exam was performed according to the departmental dose-optimization program which includes automated exposure control, adjustment of the mA and/or kV according to patient size and/or use of iterative reconstruction technique. CONTRAST:  OMNIPAQUE IOHEXOL 300 MG/ML  SOLN COMPARISON:  Chest x-ray 05/02/2010, chest x-ray 05/28/2020 FINDINGS: Lower chest: Right lower lobe base 8 x 7 mm calcified pulmonary nodule. Hepatobiliary: No focal liver abnormality. No gallstones, gallbladder wall thickening, or pericholecystic fluid. No biliary dilatation. Pancreas: No focal lesion. Normal pancreatic contour. No surrounding inflammatory changes. No main pancreatic ductal dilatation. Spleen: Normal in size without focal abnormality. Adrenals/Urinary Tract: No adrenal nodule bilaterally. Bilateral kidneys enhance symmetrically. No hydronephrosis. No hydroureter. The urinary bladder is unremarkable. Stomach/Bowel: Stomach is within normal limits. No evidence of bowel wall thickening or dilatation. Appendix appears normal. Vascular/Lymphatic: No abdominal aorta or iliac aneurysm. No abdominal, pelvic, or inguinal  lymphadenopathy. Reproductive: Uterus and bilateral adnexa are unremarkable. Other: No intraperitoneal free fluid. No intraperitoneal free gas. No organized fluid collection. Musculoskeletal: No abdominal wall hernia or abnormality. No suspicious lytic or blastic osseous lesions. No acute displaced fracture. Multilevel degenerative changes of the spine. IMPRESSION: 1. No acute intra-abdominal or intrapelvic abnormality. 2. Indeterminate right lower lobe base 8 x 7 mm calcified pulmonary nodule. Electronically Signed   By: Morgane  Naveau M.D.   On: 03/15/2024 00:52     PROCEDURES:  Critical Care performed: No      Procedures    IMPRESSION / MDM / ASSESSMENT AND PLAN / ED COURSE  I reviewed the triage vital signs and the nursing notes.    Patient here for abdominal pain, vomiting.     DIFFERENTIAL DIAGNOSIS (includes but not limited to):   Viral gastroenteritis, appendicitis, diverticulitis, colitis, bowel obstruction, UTI, kidney stone, ovarian cyst, ovarian torsion,  PID   Patient's presentation is most consistent with acute presentation with potential threat to life or bodily function.   PLAN: Will obtain labs, urine, CT of the abdomen pelvis.  Will give IV fluids, pain and nausea medicine.   MEDICATIONS GIVEN IN ED: Medications  sodium chloride  0.9 % bolus 1,000 mL (0 mLs Intravenous Stopped 03/15/24 0059)  morphine (PF) 4 MG/ML injection 4 mg (4 mg Intravenous Given 03/15/24 0041)  ondansetron  (ZOFRAN ) injection 4 mg (4 mg Intravenous Given 03/15/24 0042)  iohexol (OMNIPAQUE) 300 MG/ML solution 100 mL (100 mLs Intravenous Contrast Given 03/15/24 0032)     ED COURSE: Labs show a leukocytosis of 11,000.  Normal electrolytes, creatinine, LFTs and lipase.  COVID, flu and RSV negative.  Urine shows no sign of infection and pregnancy test is negative.  CT scan reviewed and interpreted by myself and the radiologist and shows no acute abnormality.  Appendix is normal.  Gallbladder  normal.  Normal adnexa.  She is not having pelvic tenderness or GU symptoms.  Patient reports feeling better, tolerating p.o.  Suspect viral gastroenteritis.  Discussed supportive care instructions and return precautions.  Patient does have a right lower lobe pulmonary nodule.  Will refer to the pulmonary nodule clinic.  At this time, I do not feel there is any life-threatening condition present. I reviewed all nursing notes, vitals, pertinent previous records.  All lab and urine results, EKGs, imaging ordered have been independently reviewed and interpreted by myself.  I reviewed all available radiology reports from any imaging ordered this visit.  Based on my assessment, I feel the patient is safe to be discharged home without further emergent workup and can continue workup as an outpatient as needed. Discussed all findings, treatment plan as well as usual and customary return precautions.  They verbalize understanding and are comfortable with this plan.  Outpatient follow-up has been provided as needed.  All questions have been answered.    CONSULTS:  none   OUTSIDE RECORDS REVIEWED: Reviewed last OB/GYN note in December 2022.       FINAL CLINICAL IMPRESSION(S) / ED DIAGNOSES   Final diagnoses:  Nausea and vomiting in adult  RLQ abdominal pain  Pulmonary nodule     Rx / DC Orders   ED Discharge Orders          Ordered    ondansetron  (ZOFRAN -ODT) 4 MG disintegrating tablet  Every 6 hours PRN        03/15/24 0130    dicyclomine (BENTYL) 20 MG tablet  Every 8 hours PRN        03/15/24 0130    AMB  Referral to Pulmonary Nodule Clinic        03/15/24 0141             Note:  This document was prepared using Dragon voice recognition software and may include unintentional dictation errors.   Selah Klang, Clover Dao, DO 03/15/24 0630

## 2024-03-15 ENCOUNTER — Emergency Department

## 2024-03-15 MED ORDER — MORPHINE SULFATE (PF) 4 MG/ML IV SOLN
4.0000 mg | Freq: Once | INTRAVENOUS | Status: AC
  Administered 2024-03-15: 4 mg via INTRAVENOUS
  Filled 2024-03-15: qty 1

## 2024-03-15 MED ORDER — SODIUM CHLORIDE 0.9 % IV BOLUS (SEPSIS)
1000.0000 mL | Freq: Once | INTRAVENOUS | Status: AC
  Administered 2024-03-15: 1000 mL via INTRAVENOUS

## 2024-03-15 MED ORDER — ONDANSETRON HCL 4 MG/2ML IJ SOLN
4.0000 mg | Freq: Once | INTRAMUSCULAR | Status: AC
  Administered 2024-03-15: 4 mg via INTRAVENOUS
  Filled 2024-03-15: qty 2

## 2024-03-15 MED ORDER — IOHEXOL 300 MG/ML  SOLN
100.0000 mL | Freq: Once | INTRAMUSCULAR | Status: AC | PRN
  Administered 2024-03-15: 100 mL via INTRAVENOUS

## 2024-03-15 MED ORDER — DICYCLOMINE HCL 20 MG PO TABS: 20.0000 mg | ORAL_TABLET | Freq: Three times a day (TID) | ORAL | 0 refills | Status: AC | PRN

## 2024-03-15 MED ORDER — ONDANSETRON 4 MG PO TBDP: 4.0000 mg | ORAL_TABLET | Freq: Four times a day (QID) | ORAL | 0 refills | Status: AC | PRN

## 2024-03-15 NOTE — Discharge Instructions (Signed)
 Your labs, urine, CT scan were normal today.  CT scan showed a normal appendix.  You may have a viral illness causing your symptoms.  You may alternate Tylenol  1000 mg every 6 hours as needed for pain, fever and Ibuprofen  800 mg every 6-8 hours as needed for pain, fever.  Please take Ibuprofen  with food.  Do not take more than 4000 mg of Tylenol  (acetaminophen ) in a 24 hour period.  You may use over-the-counter Imodium if you develop diarrhea.  I recommend a bland diet for the next several days.

## 2024-03-22 ENCOUNTER — Ambulatory Visit: Admitting: Student in an Organized Health Care Education/Training Program

## 2024-03-22 ENCOUNTER — Encounter: Payer: Self-pay | Admitting: Student in an Organized Health Care Education/Training Program

## 2024-03-22 VITALS — BP 100/78 | HR 82 | Temp 97.4°F | Ht 60.0 in | Wt 184.4 lb

## 2024-03-22 DIAGNOSIS — R911 Solitary pulmonary nodule: Secondary | ICD-10-CM

## 2024-03-22 DIAGNOSIS — Z7722 Contact with and (suspected) exposure to environmental tobacco smoke (acute) (chronic): Secondary | ICD-10-CM

## 2024-03-22 NOTE — Progress Notes (Signed)
 Synopsis: Referred in pulmonary nodule by Ward, Clover Dao, DO  Assessment & Plan:   #Pulmonary Nodule  Patient is presenting for the evaluation of a calcified right lower lobe nodule measuring 8 mm that was incidentally found on a CT scan of the abdomen that I have personally reviewed. She is asymptomatic and does not have any respiratory symptoms.  She was born [place] and [place] to the [place]  in [place], but does not report living in the [facility] [place]  or in [place].  She reports a positive tuberculin skin test in the past but reports that her blood work was negative.  Patient was able to provide us  a copy of her QuantiFERON gold test from August 2024 that was negative.  Given this is a calcified pulmonary nodule I suspect a previously healed infection/granuloma for which no further workup or imaging is necessary.  I have reassured the patient that this represents a previously healed infection. Also re-assured the patient that her symptoms of back pain are not related to said calcified nodule.  -Obtain QuantGOLD > sent by patient, negative August 2024   -no further workup necessary  I spent 60 minutes caring for this patient today, including preparing to see the patient, obtaining a medical history , reviewing a separately obtained history, performing a medically appropriate examination and/or evaluation, counseling and educating the patient/family/caregiver, documenting clinical information in the electronic health record, and independently interpreting results (not separately reported/billed) and communicating results to the patient/family/caregiver  Vergia Glasgow, MD [facility]  End of visit medications:  No orders of the defined types were placed in this encounter.    Current Outpatient Medications:    acetaminophen  (TYLENOL ) 500 MG tablet, Take 500 mg by mouth every 6 (six) hours as needed for headache., Disp: , Rfl:    etonogestrel  (NEXPLANON) 68 MG IMPL implant, 1 each by Subdermal route once., Disp: , Rfl:    dicyclomine  (BENTYL ) 20 MG tablet, Take 1 tablet (20 mg total) by mouth every 8 (eight) hours as needed. (Patient not taking: Reported on 03/22/2024), Disp: 15 tablet, Rfl: 0   meloxicam  (MOBIC ) 15 MG tablet, Take 1 tablet (15 mg total) by mouth daily. (Patient not taking: Reported on 03/22/2024), Disp: 30 tablet, Rfl: 0   naproxen  sodium (ALEVE ) 220 MG tablet, Take 1 tablet (220 mg total) by mouth 2 (two) times daily with a meal. (Patient not taking: Reported on 03/22/2024), Disp: 20 tablet, Rfl: 0   ondansetron  (ZOFRAN -ODT) 4 MG disintegrating tablet, Take 1 tablet (4 mg total) by mouth every 6 (six) hours as needed for nausea or vomiting. (Patient not taking: Reported on 03/22/2024), Disp: 20 tablet, Rfl: 0   Subjective:   PATIENT ID: Wendy Watson GENDER: female DOB: 02/19/1991, MRN: [id number]  Chief Complaint  Patient presents with   Consult    Recurrent cough x 1 year. Shortness of breath on exertion.     HPI  Patient is a pleasant 33 year old female with no significant past medical history presenting to clinic for the evaluation of a pulmonary nodule.  This was done with a video Spanish-speaking interpreter.  Patient was seen in the emergency department on 03/14/2024 secondary to abdominal pain, nausea, and vomiting.  Throughout the course of her evaluation, she underwent a CT scan of the abdomen and pelvis which was notable for an 8 mm calcified pulmonary nodule in the right lower lobe base.  She was referred to pulmonary for further evaluation.  Today, she  is asymptomatic and has no respiratory symptoms.  She does not have any shortness of breath, cough, chest pain, chest tightness, fevers, or chills.  She does have lower back pain.  Patient has not had any history of respiratory illness requiring hospitalization.  She does not recall having any bouts of severe pneumonia.  She does not have a personal  history of tuberculosis nor does she report exposure.  She was told she had a positive tuberculin skin test followed by a blood test that she was told was negative and for which no further workup was necessary.  Patient was born [place]  in [place].  She lived in [place]  for a year and a half and subsequently moved to [place] .  She does not have any history of smoking, vape use, or illicit drug use.  She does not report any occupational exposures.  Ancillary information including prior medications, full medical/surgical/family/social histories, and PFTs (when available) are listed below and have been reviewed.   Review of Systems  Constitutional:  Negative for chills and fever.  Respiratory:  Negative for cough, hemoptysis, sputum production, shortness of breath and wheezing.   Cardiovascular:  Negative for chest pain, palpitations, orthopnea and leg swelling.  Musculoskeletal:  Positive for back pain.     Objective:   Vitals:   03/22/24 1507  BP: 100/78  Pulse: 82  Temp: (!) 97.4 F (36.3 C)  TempSrc: Temporal  SpO2: 98%  Weight: 184 lb 6.4 oz (83.6 kg)  Height: 5' (1.524 m)   98% on RA BMI Readings from Last 3 Encounters:  03/22/24 36.01 kg/m  03/14/24 36.33 kg/m  04/08/23 36.43 kg/m   Wt Readings from Last 3 Encounters:  03/22/24 184 lb 6.4 oz (83.6 kg)  03/14/24 186 lb (84.4 kg)  04/08/23 186 lb 8.2 oz (84.6 kg)    Physical Exam Constitutional:      Appearance: Normal appearance. She is not ill-appearing.  Cardiovascular:     Rate and Rhythm: Normal rate and regular rhythm.     Pulses: Normal pulses.     Heart sounds: Normal heart sounds.  Pulmonary:     Effort: Pulmonary effort is normal.     Breath sounds: Normal breath sounds.  Neurological:     General: No focal deficit present.     Mental Status: She is alert and oriented to person, place, and time. Mental status is at baseline.       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   Past Surgical History:  Procedure Laterality Date   NO PAST SURGERIES      Social History   Socioeconomic History   Marital status: Married    Spouse name: Not on file   Number of children: Not on file   Years of education: Not on file   Highest education level: Not on file  Occupational History   Not on file  Tobacco Use   Smoking status: Never    Passive exposure: Past   Smokeless tobacco: Never  Vaping Use   Vaping status: Never Used  Substance and Sexual Activity   Alcohol use: No   Drug use: Never   Sexual activity: Yes    Partners: Male  Birth control/protection: Implant  Other Topics Concern   Not on file  Social History Narrative   Not on file   Social Drivers of Health   Financial Resource Strain: Not on file  Food Insecurity: Not on file  Transportation Needs: Not on file  Physical Activity: Not on file  Stress: Not on file  Social Connections: Not on file  Intimate Partner Violence: Not At Risk (10/23/2021)   Humiliation, Afraid, Rape, and Kick questionnaire    Fear of Current or Ex-Partner: No    Emotionally Abused: No    Physically Abused: No    Sexually Abused: No     No Known Allergies   CBC    Component Value Date/Time   WBC 11.5 (H) 03/14/2024 1935   RBC 4.75 03/14/2024 1935   HGB 13.1 03/14/2024 1935   HCT 39.6 03/14/2024 1935   PLT 393 03/14/2024 1935   MCV 83.4 03/14/2024 1935   MCH 27.6 03/14/2024 1935   MCHC 33.1 03/14/2024 1935   RDW 14.0 03/14/2024 1935   LYMPHSABS 4.1 (H) 03/14/2024 1935   MONOABS 0.6 03/14/2024 1935   EOSABS 0.4 03/14/2024 1935   BASOSABS 0.1 03/14/2024 1935    Pulmonary Functions Testing Results:     No data to display          Outpatient Medications  Prior to Visit  Medication Sig Dispense Refill   acetaminophen  (TYLENOL ) 500 MG tablet Take 500 mg by mouth every 6 (six) hours as needed for headache.     etonogestrel (NEXPLANON) 68 MG IMPL implant 1 each by Subdermal route once.     dicyclomine  (BENTYL ) 20 MG tablet Take 1 tablet (20 mg total) by mouth every 8 (eight) hours as needed. (Patient not taking: Reported on 03/22/2024) 15 tablet 0   meloxicam  (MOBIC ) 15 MG tablet Take 1 tablet (15 mg total) by mouth daily. (Patient not taking: Reported on 03/22/2024) 30 tablet 0   naproxen  sodium (ALEVE ) 220 MG tablet Take 1 tablet (220 mg total) by mouth 2 (two) times daily with a meal. (Patient not taking: Reported on 03/22/2024) 20 tablet 0   ondansetron  (ZOFRAN -ODT) 4 MG disintegrating tablet Take 1 tablet (4 mg total) by mouth every 6 (six) hours as needed for nausea or vomiting. (Patient not taking: Reported on 03/22/2024) 20 tablet 0   No facility-administered medications prior to visit.

## 2024-03-23 ENCOUNTER — Encounter: Payer: Self-pay | Admitting: Student in an Organized Health Care Education/Training Program
